# Patient Record
Sex: Male | Born: 1965 | Race: White | Hispanic: No | Smoking: Never smoker
Health system: Southern US, Community
[De-identification: ages and names within clinical notes are randomized; demographics above are authoritative.]

## PROBLEM LIST (undated history)

## (undated) DIAGNOSIS — T148XXA Other injury of unspecified body region, initial encounter: Secondary | ICD-10-CM

## (undated) DIAGNOSIS — F329 Major depressive disorder, single episode, unspecified: Secondary | ICD-10-CM

## (undated) DIAGNOSIS — I10 Essential (primary) hypertension: Secondary | ICD-10-CM

## (undated) DIAGNOSIS — F32A Depression, unspecified: Secondary | ICD-10-CM

## (undated) DIAGNOSIS — F419 Anxiety disorder, unspecified: Secondary | ICD-10-CM

## (undated) HISTORY — PX: APPENDECTOMY: SHX54

## (undated) HISTORY — DX: Depression, unspecified: F32.A

## (undated) HISTORY — DX: Major depressive disorder, single episode, unspecified: F32.9

## (undated) HISTORY — DX: Anxiety disorder, unspecified: F41.9

## (undated) HISTORY — DX: Other injury of unspecified body region, initial encounter: T14.8XXA

---

## 1994-02-06 HISTORY — PX: CHOLECYSTECTOMY: SHX55

## 2002-02-06 HISTORY — PX: OTHER SURGICAL HISTORY: SHX169

## 2009-05-18 ENCOUNTER — Ambulatory Visit: Payer: Self-pay | Admitting: Family Medicine

## 2009-05-18 DIAGNOSIS — I10 Essential (primary) hypertension: Secondary | ICD-10-CM | POA: Insufficient documentation

## 2009-05-30 ENCOUNTER — Ambulatory Visit: Payer: Self-pay | Admitting: Family Medicine

## 2009-05-30 DIAGNOSIS — L301 Dyshidrosis [pompholyx]: Secondary | ICD-10-CM | POA: Insufficient documentation

## 2009-05-31 LAB — CONVERTED CEMR LAB
ALT: 16 units/L (ref 0–53)
AST: 19 units/L (ref 0–37)
Alkaline Phosphatase: 83 units/L (ref 39–117)
BUN: 9 mg/dL (ref 6–23)
CO2: 26 meq/L (ref 19–32)
Creatinine, Ser: 0.76 mg/dL (ref 0.40–1.50)
HDL: 55 mg/dL (ref >39)
LDL Cholesterol: 38 mg/dL (ref 0–99)
RDW: 13 % (ref 11.5–15.5)
Sodium: 141 meq/L (ref 135–145)
Total Bilirubin: 0.8 mg/dL (ref 0.3–1.2)
WBC: 5.3 10*3/uL (ref 4.0–10.5)

## 2009-06-20 ENCOUNTER — Ambulatory Visit: Payer: Self-pay | Admitting: Family Medicine

## 2009-07-17 ENCOUNTER — Ambulatory Visit: Payer: Self-pay | Admitting: Family Medicine

## 2009-07-17 DIAGNOSIS — S46909A Unspecified injury of unspecified muscle, fascia and tendon at shoulder and upper arm level, unspecified arm, initial encounter: Secondary | ICD-10-CM | POA: Insufficient documentation

## 2009-07-17 DIAGNOSIS — S4980XA Other specified injuries of shoulder and upper arm, unspecified arm, initial encounter: Secondary | ICD-10-CM | POA: Insufficient documentation

## 2009-07-18 LAB — CONVERTED CEMR LAB
CO2: 27 meq/L (ref 19–32)
Chloride: 104 meq/L (ref 96–112)
Creatinine, Ser: 0.77 mg/dL (ref 0.40–1.50)
Glucose, Bld: 99 mg/dL (ref 70–99)

## 2009-09-01 ENCOUNTER — Ambulatory Visit: Payer: Self-pay | Admitting: Family Medicine

## 2009-09-01 ENCOUNTER — Encounter: Admission: RE | Admit: 2009-09-01 | Discharge: 2009-09-01 | Payer: Self-pay | Admitting: Family Medicine

## 2009-09-01 DIAGNOSIS — M25519 Pain in unspecified shoulder: Secondary | ICD-10-CM | POA: Insufficient documentation

## 2009-09-01 DIAGNOSIS — M67919 Unspecified disorder of synovium and tendon, unspecified shoulder: Secondary | ICD-10-CM | POA: Insufficient documentation

## 2009-09-01 DIAGNOSIS — M719 Bursopathy, unspecified: Secondary | ICD-10-CM

## 2009-09-14 ENCOUNTER — Ambulatory Visit: Payer: Self-pay | Admitting: Family Medicine

## 2009-09-14 ENCOUNTER — Encounter: Admission: RE | Admit: 2009-09-14 | Discharge: 2009-09-14 | Payer: Self-pay | Admitting: Family Medicine

## 2009-09-14 DIAGNOSIS — M5090 Cervical disc disorder, unspecified, unspecified cervical region: Secondary | ICD-10-CM | POA: Insufficient documentation

## 2009-09-29 ENCOUNTER — Ambulatory Visit: Payer: Self-pay | Admitting: Family Medicine

## 2010-05-08 NOTE — Assessment & Plan Note (Signed)
Summary: RTC syndrome   Vital Signs:  Patient profile:   45 year old male Height:      70.5 inches Weight:      239 pounds Pulse rate:   94 / minute BP sitting:   145 / 91  (left arm) Cuff size:   large  Vitals Entered By: Kathlene November (Sep 01, 2009 2:42 PM) CC: right shoulder pain still- Motrin and home exercises no help- pain is worse   Primary Care Provider:  Nani Gasser MD  CC:  right shoulder pain still- Motrin and home exercises no help- pain is worse.  History of Present Illness: 45 yo WM presents for continued R shoulder pain x 6 mos, getting worse.  Saw Dr Linford Arnold for it last month and has only been taking Motrin but it is not helping.  Has full ROM but has constant pain with movement and at rest.  Keepiing him up at6 night.  Over the superior, posterior and anterior region, radiates down the deltoid.  No neck pain or paresthesias.  Has never had shoulder surgery or imaging done.  Current Medications (verified): 1)  Lisinopril-Hydrochlorothiazide 20-12.5 Mg Tabs (Lisinopril-Hydrochlorothiazide) .... Take 1 Tablet By Mouth Once A Day  Allergies (verified): No Known Drug Allergies  Comments:  Nurse/Medical Assistant: The patient's medications and allergies were reviewed with the patient and were updated in the Medication and Allergy Lists. Kathlene November (Sep 01, 2009 2:43 PM)  Past History:  Past Medical History: Reviewed history from 05/18/2009 and no changes required. Nerve damage from neck surgery  Social History: Reviewed history from 05/30/2009 and no changes required. 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  HS degree w/ some educa at [employer].  LIves alone. Has one child.  Non-smoker ETOH-yes Drugs-no Company secretary Regular exercise-yes  Review of Systems      See HPI  Physical Exam  General:  alert, well-developed, well-nourished, and well-hydrated.   Msk:  Neg Hawkins sign.  + empty can test.  full stregnth with drop arm test.   Slightly limited int/ ext rotation.  full opposed stregth of bicepts/ tricepts.  Neg Yergasons' test.  + grip 5/5 bilat, full C spine active ROM Pulses:  2+ radial pulses Skin:  color normal.     Impression & Recommendations:  Problem # 1:  SHOULDER PAIN, RIGHT (ICD-719.41) RTC tendonitis, probable.  Due to mod-severe pain and findings on exam, did corticosteroid injection.  Pt tolerated well.  Will procede with XRAy just in case he needs MRI down the road.  Will start RX Meloxicam and Vicodin only at night.  RTC in 3-4 wks for f/u. His updated medication list for this problem includes:    Meloxicam 7.5 Mg Tabs (Meloxicam) .Marland Kitchen... 1-2 tabs by mouth daily with food    Hydrocodone-acetaminophen 5-500 Mg Tabs (Hydrocodone-acetaminophen) .Marland Kitchen... 1-2 tabs by mouth at bedtime as needed shoulder pain  Orders: T-DG Shoulder*R* (62130)  Complete Medication List: 1)  Lisinopril-hydrochlorothiazide 20-12.5 Mg Tabs (Lisinopril-hydrochlorothiazide) .... Take 1 tablet by mouth once a day 2)  Meloxicam 7.5 Mg Tabs (Meloxicam) .Marland Kitchen.. 1-2 tabs by mouth daily with food 3)  Hydrocodone-acetaminophen 5-500 Mg Tabs (Hydrocodone-acetaminophen) .Marland Kitchen.. 1-2 tabs by mouth at bedtime as needed shoulder pain  Other Orders: Joint Aspirate / Injection, Large (20610)  Patient Instructions: 1)  Xray shoulder today. 2)  Will call you w/ results on Tues. 3)  Inject shoulder today. 4)  Start Meloxicam once a day with food as your anti inflammatory. 5)  Use Vicodin at night  for severe pain. 6)  Return for f/u shoulder pain in 4 wks. Prescriptions: HYDROCODONE-ACETAMINOPHEN 5-500 MG TABS (HYDROCODONE-ACETAMINOPHEN) 1-2 tabs by mouth at bedtime as needed shoulder pain  #24 x 0   Entered and Authorized by:   Seymour Bars DO   Signed by:   Seymour Bars DO on 09/01/2009   Method used:   Printed then faxed [facility] ...       [facility]  [address] [phone]* (retail)       [address] [place], [place]  [address]       Ph:  [phone] or [phone]       Fax: [phone]   RxID:   [id number] MELOXICAM 7.5 MG TABS (MELOXICAM) 1-2 tabs by mouth daily with food  #40 x 1   Entered and Authorized by:   Seymour Bars DO   Signed by:   Seymour Bars DO on 09/01/2009   Method used:   [facility]        [facility] [phone]* (retail)       [address] [place], [place]  [address]       Ph: [phone] or [phone]       Fax: [phone]   RxID:   [id number]    Procedure Note  Injections: The patient complains of pain but denies fever. Duration of symptoms: 6 mos Indication: acute on chronic pain Consent signed: no  Procedure # 1: joint injection    Region: R shoulder    Location: posterior approach    Technique: 22 gauge    Medication: 40 mg depomedrol    Anesthesia: cold spray    Comment: DepoMedrol 40mg  L#[id number] Exp.04/2011 Lidocaine 1% L# [id number]  Exp.01/2012  Cleaned and prepped with: alcohol and betadine Wound dressing: bandaid Instructions: daily dressing changes and ice Additional Instructions: Pt tolerated procedure well w/o complication.

## 2010-05-08 NOTE — Assessment & Plan Note (Signed)
Summary: f/u neck/ shoulder pain   Vital Signs:  Patient profile:   45 year old male Height:      70.5 inches Weight:      240 pounds BMI:     34.07 O2 Sat:      78 % on Room air Pulse rate:   78 / minute BP sitting:   119 / 80  (left arm) Cuff size:   large  Vitals Entered By: Payton Spark CMA (September 14, 2009 10:05 AM)  O2 Flow:  Room air CC: R shoulder pain no better   Primary Care Provider:  Nani Gasser MD  CC:  R shoulder pain no better.  History of Present Illness: 45 yo WM presents for R shoulder pain that is no better after a steroid injection 2 wks ago.  His pain is unchanged.  His Xray 2 wks ago was normal.  He has day and night pain.  the Vicodin didn't even help him.  He is taking Mobic daily which helps some.  He has had to leave work due to the pain.  He has full ROM and +  radiation of pain shooting down the the hand w/ mild hand weakness, no numbness.  Pain is interupting his sleep.  He has hx of cervical disc surgery from 2002 at [facility].      Current Medications (verified): 1)  Lisinopril-Hydrochlorothiazide 20-12.5 Mg Tabs (Lisinopril-Hydrochlorothiazide) .... Take 1 Tablet By Mouth Once A Day 2)  Meloxicam 7.5 Mg Tabs (Meloxicam) .Marland Kitchen.. 1-2 Tabs By Mouth Daily With Food  Allergies (verified): No Known Drug Allergies  Past History:  Past Medical History: Reviewed history from 05/18/2009 and no changes required. 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 Neck surgery, cervical disckectomy 11/03 Cholecystectomy 11/95  Social History: Reviewed history from 05/30/2009 and no changes required. 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  LIves alone. Has one child.  Non-smoker ETOH-yes Drugs-no Company secretary Regular exercise-yes  Review of Systems      See HPI  Physical Exam  General:  alert, well-developed, well-nourished, and  well-hydrated.   Msk:  slight limited active C spine ROM with SB and rotation. tender over posterior and anterior R trapezious muscle with full R glenohumeral ROM,  grip + 5/5 bilat.   Pulses:  2+ radial pulses   Impression & Recommendations:  Problem # 1:  CERVICAL DISC DISORDER (ICD-722.91) Hx ov neck surgery in 03 with nerve damage.  He has not responded to a corticosteroid injection in the R shoulder, NSAIDs or Vicodin with symptoms of cervical radiculopathy.  Also had a normal shoulder xray and has full shoulder ROM w/o pain.  Will get a CT of the neck today to look for etiology of his worsening cervical radicular pain.  Will change his pain medication to Percocet and continue Meloxicam.   Orders: T-CT C Spine w/CM ([id number])  Complete Medication List: 1)  Lisinopril-hydrochlorothiazide 20-12.5 Mg Tabs (Lisinopril-hydrochlorothiazide) .... Take 1 tablet by mouth once a day 2)  Meloxicam 7.5 Mg Tabs (Meloxicam) .Marland Kitchen.. 1-2 tabs by mouth daily with food 3)  Percocet 5-325 Mg Tabs (Oxycodone-acetaminophen) .Marland Kitchen.. 1-2 tabs by mouth two times a day as needed severe pain  Patient Instructions: 1)  CT neck today. 2)  Will call you w/ results tomorrow. 3)  Change Vicodin to Percocet.   4)  this may cause constipation or sedation. 5)  Stay on  Meloxicam once a day. Prescriptions: PERCOCET 5-325 MG TABS (OXYCODONE-ACETAMINOPHEN) 1-2 tabs by mouth two times a day as needed severe pain  #30 x 0   Entered and Authorized by:   Seymour Bars DO   Signed by:   Seymour Bars DO on 09/14/2009   Method used:   Print then Give to Patient   RxID:   [id number]

## 2010-05-08 NOTE — Letter (Signed)
Summary: Out of Work  [facility]  [address]   [place], [place] [address]   Phone: [phone]  Fax: [phone]    May 18, 2009   Employee:  Christopher Lloyd    To Whom It May Concern:   For Medical reasons, please excuse the above named employee from work for the following dates:  Start:   18 May 2009    End:   21 May 2009  If you need additional information, please feel free to contact our office.         Sincerely,    Marvis Moeller DO

## 2010-05-08 NOTE — Assessment & Plan Note (Signed)
Summary: Lack of concentratin, HTN   Vital Signs:  Patient profile:   45 year old male Height:      70.5 inches Weight:      244 pounds Pulse rate:   89 / minute BP sitting:   156 / 88  (left arm) Cuff size:   large  Vitals Entered By: Kathlene November (June 20, 2009 3:56 PM) CC: light headed and trouble concentrating since Saturday   Primary Care Provider:  Nani Gasser MD  CC:  light headed and trouble concentrating since Saturday.  History of Present Illness: Christopher Lloyd is a 45 year old man presenting with trouble concentrating. He has been having episodes every day since Saturday in which he has trouble concentrating for several hours. For example, one day he was counting change and found that he was confused and could not concentrate on the task. 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 When this happens he has to fight to keep close attention. This has been going on for years but this is the first time it has happened in 6 months.  No memory problems or distractability. No warning signs, nausea, abdominal pain, headache. Has not tried any medicine. No vision changes, hearing changes, speech changes, tingling, or numbness.   BP is 156/88 today.  Current Medications (verified): 1)  Triamcinolone Acetonide 0.5 % Oint (Triamcinolone Acetonide) .... Apply To Affected Area On Feet Two Times A Day For Up To 2 Weeks.  Allergies (verified): No Known Drug Allergies  Comments:  Nurse/Medical Assistant: The patient's medications and allergies were reviewed with the patient and were updated in the Medication and Allergy Lists. Kathlene November (June 20, 2009 3:57 PM)  Physical Exam  General:  Overweight-appearing male in no acute distress.  Head:  Normocephalic and atraumatic with male-pattern balding. Facial flushing present.  Eyes:  Sclera clear with no corneal or conjunctival inflammation noted. Neck:  Neck supple with no lymphadenopathy. Lungs:  Normal  respiratory effort, chest expands symmetrically. Lungs are clear to auscultation with no wheezes, rales, or rhonchi.  Heart:  Regular rate and rhythm. Normal S1 and S2 with no murmur, rub, or gallop. Pulses:  Radial pulses 2+ bilaterally. Extremities:  No cyanosis, clubbing, or edema.  Skin:  no rashes.   Cervical Nodes:  No lymphadenopathy noted Psych:  Flat affect.    Impression & Recommendations:  Problem # 1:  ELEVATED BLOOD PRESSURE WITHOUT DIAGNOSIS OF HYPERTENSION (ICD-796.2) Episodes of poor concentration may be related to spikes in blood pressure. BP 156/88 today and he has gotten high readings at home in the past. Will start lisinopril-HCTZ today. Counseled pt on possible side effects. Follow up in 3 weeks to determine whether episodes have decreased once BP is better controlled. still continue to work on weight loss. Says he already abides by low salt diet.   His updated medication list for this problem includes:    Lisinopril-hydrochlorothiazide 10-12.5 Mg Tabs (Lisinopril-hydrochlorothiazide) .Marland Kitchen... Take 1 tablet by mouth once a day  Complete Medication List: 1)  Triamcinolone Acetonide 0.5 % Oint (Triamcinolone acetonide) .... Apply to affected area on feet two times a day for up to 2 weeks. 2)  Lisinopril-hydrochlorothiazide 10-12.5 Mg Tabs (Lisinopril-hydrochlorothiazide) .... Take 1 tablet by mouth once a day Prescriptions: LISINOPRIL-HYDROCHLOROTHIAZIDE 10-12.5 MG TABS (LISINOPRIL-HYDROCHLOROTHIAZIDE) Take 1 tablet by mouth once a day  #30 x 1   Entered and Authorized by:   Nani Gasser MD   Signed by:   Nani Gasser MD on 06/20/2009   Method  used:   [facility]        [facility] [phone]* (retail)       [address] [place], [place]  [address]       Ph: [phone] or [phone]       Fax: [phone]   RxID:   [id number]

## 2010-05-08 NOTE — Assessment & Plan Note (Signed)
Summary: NOV: BP   Vital Signs:  Patient profile:   45 year old male Height:      70.5 inches Weight:      242 pounds BMI:     34.36 Pulse rate:   78 / minute BP sitting:   138 / 84  (left arm) Cuff size:   large  Vitals Entered By: Kathlene November (May 30, 2009 9:19 AM) CC: NP- get established Is Patient Diabetic? No   Primary Care Provider:  Nani Gasser MD  CC:  NP- get established.  History of Present Illness: Here to estab care because told had high BP at urgent care and worreid because he has a family hs ov cancer. No CP, SOB or HA.   Rash on feet for several years.  Itches some. ON teh base of the feel.  By the end of the day the socks are wet. Says rash is worse by the end of the day. Not using any OTC treatments. Says his feet have never been sweaty before.   Habits & Providers  Alcohol-Tobacco-Diet     Alcohol drinks/day: 2     Tobacco Status: never  Exercise-Depression-Behavior     Does Patient Exercise: yes     STD Risk: never     Drug Use: never     Seat Belt Use: always  Current Medications (verified): 1)  None  Allergies (verified): No Known Drug Allergies  Comments:  Nurse/Medical Assistant: The patient's medications and allergies were reviewed with the patient and were updated in the Medication and Allergy Lists. Kathlene November (May 30, 2009 9:20 AM)  Past History:  Past Surgical History: Neck surgery, disckectomy 11/03 Cholecystectomy 11/95  Family History: Mother,D, [place] GM died brain Ca Aunt died leukemia Father, Zachery Dauer HTN  Social History: Company secretary for Teacher, adult education.  HS degree w/ some educa at [employer].  LIves alone. Has one child.  Non-smoker ETOH-yes Drugs-no Company secretary Regular exercise-yes Smoking Status:  never Does Patient Exercise:  yes STD Risk:  never Drug Use:  never Seat Belt Use:  always  Review of Systems       No fever/sweats/weakness, unexplained weight loss/gain.  No  vison changes.  No difficulty hearing/ringing in ears, hay fever/allergies.  No chest pain/discomfort, palpitations.  No Br lump/nipple discharge.  No cough/wheeze.  No blood in BM, nausea/vomiting/diarrhea.  No nighttime urination, leaking urine, unusual vaginal bleeding, discharge (penis or vagina).  No muscle/joint pain. No rash, change in mole.  No HA, memory loss.  No anxiety, +sleep d/o, depression.  No easy bruising/bleeding, unexplained lump.    Physical Exam  General:  Well-developed,well-nourished,in no acute distress; alert,appropriate and cooperative throughout examination Head:  Normocephalic and atraumatic without obvious abnormalities. No apparent alopecia or balding. Eyes:  No corneal or conjunctival inflammation noted. EOMI. Perrla. Ears:  External ear exam shows no significant lesions or deformities.  Otoscopic examination reveals clear canals, tympanic membranes are intact bilaterally without bulging, retraction, inflammation or discharge. Hearing is grossly normal bilaterally. Nose:  External nasal examination shows no deformity or inflammation. Nasal mucosa are pink and moist without lesions or exudates. Mouth:  Oral mucosa and oropharynx without lesions or exudates.  Teeth in good repair. Neck:  No deformities, masses, or tenderness noted. No TM.  Lungs:  Normal respiratory effort, chest expands symmetrically. Lungs are clear to auscultation, no crackles or wheezes. Heart:  Normal rate and regular rhythm. S1 and S2 normal without gallop, murmur, click, rub or other extra sounds.  No carotid or abdominal bruits.  Abdomen:  Bowel sounds positive,abdomen soft and non-tender without masses, organomegaly or hernias noted. Pulses:  Radial and DP pulse 2+  Extremities:  Trace edewma on the left ankle.  Neurologic:  alert & oriented X3.   Skin:  no rashes.   Cervical Nodes:  No lymphadenopathy noted Psych:  Cognition and judgment appear intact. Alert and cooperative with normal  attention span and concentration. No apparent delusions, illusions, hallucinations   Impression & Recommendations:  Problem # 1:  ELEVATED BLOOD PRESSURE WITHOUT DIAGNOSIS OF HYPERTENSION (ICD-796.2) Improved today but pt says has reported mutiple time of elevation. Discussed low salt diet. low fat diet and weight loss. Really needs to lose about 40 lbs.   Will also get screening labs to rule out thyroid problems, kidney dz, and cholesterol. F/U in 4 weeks to see if deitary changes and exericse have made some improvement.   Orders: T-Comprehensive Metabolic Panel [id number]) T-Lipid Profile [id number]) T-TSH [id number]) T-CBC No Diff ([id number])  Complete Medication List: 1)  Triamcinolone Acetonide 0.5 % Oint (Triamcinolone acetonide) .... Apply to affected area on feet two times a day for up to 2 weeks.  Patient Instructions: 1)  Google DASH diet (.nih/gov) website.  2)  Recommend weight loss and low fat diet.  Prescriptions: TRIAMCINOLONE ACETONIDE 0.5 % OINT (TRIAMCINOLONE ACETONIDE) Apply to affected area on feet two times a day for up to 2 weeks.  #40 grams. x 0   Entered and Authorized by:   Nani Gasser MD   Signed by:   Nani Gasser MD on 05/30/2009   Method used:   [facility]        [facility] [phone]* (retail)       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 If not better in 2 weeks the please let me know and can try an nystatin powder but the rash doesn look fungs  On the base of the foot has thick dyr scaling skin, no erythema, cracks or drainage.   Complete Medication List: 1)  Triamcinolone Acetonide 0.5 % Oint (Triamcinolone acetonide) .... Apply to affected area on feet two times a  day for up to 2 weeks.

## 2010-05-08 NOTE — Assessment & Plan Note (Signed)
Summary: FEVER,CHILLS,COUGH/TJ   Vital Signs:  Patient Profile:   45 Years Old Male CC:      Cough x 2 months, earache, sore throat x 2 weeks Height:     70 inches Weight:      238 pounds O2 Sat:      100 % O2 treatment:    Room Air Temp:     98.2 degrees F oral Pulse rate:   87 / minute Pulse rhythm:   regular Resp:     16 per minute BP sitting:   156 / 103  (right arm) Cuff size:   large  Vitals Entered By: Emilio Math (May 18, 2009 5:38 PM)                  Current Allergies: No known allergies History of Present Illness Chief Complaint: Cough x 2 months, earache, sore throat x 2 weeks History of Present Illness: PATIENT REPORTS HE HAS HAD A COUGH INTERMITTANTLY FOR OVER 2 MONTHS. COUGH IS DRY. KEEPING HIM FORM SLEEPING ON OCCASION. DENIES FEVER. OVER THE PAST 2 WKS HE HAS DEVELOPED A SORE THROAT AND SOME EAR PAIN. HAS RUNNY NOSE. DENIES ANY OTC MEDICATION USE. DENIES COUGHING UP ANY BLOOD. DOES NOT HAVE A FAMILY PHYSICIAN. STATES EHE LAST TIME HE HAS A GOOD CHECK UP WAS 5 YRS AGO. 3 YRS AGO HE SPENT THE NIGHT IN THE HOSPITAL DUE TO CHEST PAIN BUT STATES HIS TESTS WHERE ALL NEG. HE GIVE A FAMILY HX OF CANCER. NOT SURE TYPE BUT HIS MOTHERE DIED IN HER FIFTIES OF " BEING EAT UP WITH CANCER"  REVIEW OF SYSTEMS Constitutional Symptoms      Denies fever, chills, night sweats, weight loss, weight gain, and fatigue.  Eyes       Denies change in vision, eye pain, eye discharge, glasses, contact lenses, and eye surgery. Ear/Nose/Throat/Mouth       Complains of frequent runny nose, sinus problems, and sore throat.      Denies hearing loss/aids, change in hearing, ear pain, ear discharge, dizziness, frequent nose bleeds, hoarseness, and tooth pain or bleeding.  Respiratory       Complains of dry cough.      Denies productive cough, wheezing, shortness of breath, asthma, bronchitis, and emphysema/COPD.  Cardiovascular       Denies murmurs, chest pain, and tires easily with  exhertion.    Gastrointestinal       Denies stomach pain, nausea/vomiting, diarrhea, constipation, blood in bowel movements, and indigestion. Genitourniary       Denies painful urination, kidney stones, and loss of urinary control. Neurological       Denies paralysis, seizures, and fainting/blackouts. Musculoskeletal       Complains of muscle pain and joint pain.      Denies joint stiffness, decreased range of motion, redness, swelling, muscle weakness, and gout.  Skin       Denies bruising, unusual mles/lumps or sores, and hair/skin or nail changes.  Psych       Complains of sleep problems.      Denies mood changes, temper/anger issues, anxiety/stress, speech problems, and depression.  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 CONSTANT COUGH Heart: regular rate and  rhythm, no murmur Extremities: normal extremities Skin: no obvious rashes or lesions Assessment New Problems: ELEVATED BLOOD PRESSURE WITHOUT DIAGNOSIS OF HYPERTENSION (ICD-796.2) COUGH (ICD-786.2) UPPER RESPIRATORY INFECTION, ACUTE (ICD-465.9)   Plan New Medications/Changes: CHERATUSSIN AC 100-10 MG/5ML SYRP (GUAIFENESIN-CODEINE) 1-2 TSP by mouth Q 6 HRS as needed COUGH  #4 OZ x 0, 05/18/2009, Kimberly Lykins DO ZITHROMAX Z-PAK 250 MG TABS (AZITHROMYCIN) TAKE AS DIRECTED  #1 PK x 0, 05/18/2009, Marvis Moeller DO  New Orders: New Patient Level IV [99204]     Prescriptions: CHERATUSSIN AC 100-10 MG/5ML SYRP (GUAIFENESIN-CODEINE) 1-2 TSP by mouth Q 6 HRS as needed COUGH  #4 OZ x 0   Entered and Authorized by:   Marvis Moeller DO   Signed by:   Marvis Moeller DO on 05/18/2009   Method used:   Print then Give to Patient   RxID:   [id number] ZITHROMAX Z-PAK 250 MG TABS (AZITHROMYCIN) TAKE AS DIRECTED  #1 PK x 0   Entered and Authorized by:   Marvis Moeller DO   Signed by:   Marvis Moeller DO on 05/18/2009   Method used:   Print then Give to Patient   RxID:   [id number]   Patient Instructions: 1)  TYLENOL OR MOTRIN AS NEEDED. AVOID CAFFEINE AND MILK PRODUCTS. RECOMMEND HE ESTABLISH WITH A PCP TO EVAL ELEVATED BP, AND FOR HEALTH MAINTAINECE.

## 2010-05-08 NOTE — Assessment & Plan Note (Signed)
Summary: 3 WEEK FU HTN, new onset shoulder pain   Vital Signs:  Patient profile:   45 year old male Height:      70.5 inches Weight:      244 pounds Pulse rate:   83 / minute BP sitting:   135 / 83  (left arm) Cuff size:   large  Vitals Entered By: Kathlene November (July 17, 2009 10:11 AM) CC: follow-up Bp and having right shoulder pain for 2-3 months now, Hypertension Management   Primary Care Provider:  Nani Gasser MD  CC:  follow-up Bp and having right shoulder pain for 2-3 months now and Hypertension Management.  History of Present Illness: follow-up Bp and having right shoulder pain for 2-3 months now.  Started after threw a 70 lb box at work into [employer]. Pain is through entire right shoulder. Occ pain will shoot tino the arm above the wrist.  Feels occ weak. Can move it without difficulty.  Says pain will just start and when does is almost unbearable. No aggreavting or aleviating sxs. No pain meds.    NO rash.  No old injuries to that shoulder.    Hypertension History:      He denies headache, chest pain, palpitations, dyspnea with exertion, orthopnea, PND, peripheral edema, visual symptoms, neurologic problems, syncope, and side effects from treatment.  After about a week felt better, felt less fuzzy headed. Marland Kitchen        Positive major cardiovascular risk factors include hypertension.  Negative major cardiovascular risk factors include male age less than 64 years old and non-tobacco-user status.     Current Medications (verified): 1)  Lisinopril-Hydrochlorothiazide 10-12.5 Mg Tabs (Lisinopril-Hydrochlorothiazide) .... Take 1 Tablet By Mouth Once A Day  Allergies (verified): No Known Drug Allergies  Comments:  Nurse/Medical Assistant: The patient's medications and allergies were reviewed with the patient and were updated in the Medication and Allergy Lists. Kathlene November (July 17, 2009 10:12 AM)  Past History:  Past Surgical History: Neck surgery, cervical  disckectomy 11/03 Cholecystectomy 11/95  Physical Exam  General:  Well-developed,well-nourished,in no acute distress; alert,appropriate and cooperative throughout examination Msk:  Right shoulder with no swelling or rsash. NOntender. NROM.  + empty can test. Hand strength 5/5. Normal crossover.  Pulses:  Radial 2+    Impression & Recommendations:  Problem # 1:  HYPERTENSION, BENIGN (ICD-401.1)  Looks much better today. Adjust dose to titrate down to 120s.  His updated medication list for this problem includes:    Lisinopril-hydrochlorothiazide 20-12.5 Mg Tabs (Lisinopril-hydrochlorothiazide) .Marland Kitchen... 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  Had +empty can test.  Discussed use of NSAIDs for 5 days. Hopefully this won't incrase his BP.  Also given H.O.on exercises for RCC.  IF not better in 2 weeks then please f/u. WIll hold off on xray since no trauma or impact injury.   Complete Medication List: 1)  Lisinopril-hydrochlorothiazide 20-12.5 Mg Tabs (Lisinopril-hydrochlorothiazide) .... Take 1 tablet by mouth once a day 2)  Trazodone Hcl 50 Mg Tabs (Trazodone hcl) .... Take 1 tablet by mouth once a day at bedtime for insomnia.  Hypertension Assessment/Plan:      The patient's hypertensive risk group is category A: No risk factors and no target organ damage.  His calculated 10 year risk of coronary heart disease is 3 %.  Today's blood pressure is 135/83.  His blood pressure goal is < 140/90.  Patient Instructions: 1)  Please schedule a follow-up appointment in 3 months  to recheck blood pressure. 2)  Go for labs today to check kidney function and potassium.  3)  Work on stretches for the shoulder and given handout. 4)  Take Ibuprofen 600mg  three times a day with food and water for about 5 days and then as needed for the shoulder.  5)  Follow up in 2  weeks if the shoulder is not better.  Prescriptions: TRAZODONE HCL 50 MG TABS (TRAZODONE HCL) Take 1 tablet by mouth once a day at bedtime for insomnia.  #30 x 0   Entered and Authorized by:   Nani Gasser MD   Signed by:   Nani Gasser MD on 07/17/2009   Method used:   [facility]ically to        [facility] [phone] (retail)       [address]       [place], [place]  [address]       Ph: [phone] or [phone]       Fax: [phone]   RxID:   [id number] LISINOPRIL-HYDROCHLOROTHIAZIDE 20-12.5 MG TABS (LISINOPRIL-HYDROCHLOROTHIAZIDE) Take 1 tablet by mouth once a day  #30 x 2   Entered and Authorized by:   Nani Gasser MD   Signed by:   Nani Gasser MD on 07/17/2009   Method used:   Electronically to        [facility] [phone]* (retail)       [address]       [place], [place]  [address]       Ph: [phone] or [phone]       Fax: [phone]   RxID:   [id number]

## 2010-06-11 ENCOUNTER — Ambulatory Visit (INDEPENDENT_AMBULATORY_CARE_PROVIDER_SITE_OTHER): Payer: Self-pay | Admitting: Family Medicine

## 2010-06-11 ENCOUNTER — Encounter: Payer: Self-pay | Admitting: Family Medicine

## 2010-06-11 DIAGNOSIS — I10 Essential (primary) hypertension: Secondary | ICD-10-CM

## 2010-06-11 DIAGNOSIS — G4700 Insomnia, unspecified: Secondary | ICD-10-CM

## 2010-06-19 NOTE — Assessment & Plan Note (Signed)
Summary: HTN, insomnia   Vital Signs:  Patient profile:   45 year old male Height:      70.5 inches Weight:      266.75 pounds BMI:     37.87 O2 Sat:      100 % on Room air Pulse rate:   78 / minute BP sitting:   159 / 104  (right arm) Cuff size:   large  Vitals Entered By: Christopher Lloyd CMA Christopher Lloyd) (June 11, 2010 3:34 PM)  O2 Flow:  Room air CC: High BP, no meter at home "I can just tell", no meds in 4-5 months....SP Is Patient Diabetic? No   Primary Care Provider:  Nani Gasser Lloyd  CC:  High BP, no meter at home "I can just tell", and no meds in 4-5 months....SP.  History of Present Illness: High BP, no meter at home "I can just tell", no meds in 4-5 months....SP. Has been having HA and feeling lightheaed.Marland Kitchen Has been oout of BP meds for several months.  Tolerated previous med well.   Insomnia for 10 years. Christopher Lloyd goes to bed around 10:300 or 11.  Wakes up at 7AM.  Has a problem staying asleep.  Once wakes up usually awake for 2-3 hours.  Often take a cuople of hours to sleep. Doesn't watch TV in the bedroom. Does listen ot the radio though. BEdroom is quiet and dark. No caffeine after lunch. Tried trazodone in the past and felt it didn't work well.    Current Medications (verified): 1)  None  Allergies (verified): No Known Drug Allergies  Family History: Reviewed history from 05/30/2009 and no changes required. Mother,D, CA GM died brain Ca Aunt died leukemia Father, UNK Ucnle HTN  Social History: Reviewed history from 05/30/2009 and no changes required. 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  HS degree w/ some educa at [employer].  LIves alone. Has one child.  Non-smoker ETOH-yes Drugs-no Company secretary Regular exercise-yes  Physical Exam  General:  Well-developed,well-nourished,in no acute distress; alert,appropriate and cooperative throughout examination Lungs:  Normal respiratory effort, chest expands symmetrically. Lungs are clear to  auscultation, no crackles or wheezes. Heart:  Normal rate and regular rhythm. S1 and S2 normal without gallop, murmur, click, rub or other extra sounds. no carotid bruits.  Pulses:  RAdial 2+  Extremities:  NO LE edema.    Impression & Recommendations:  Problem # 1:  HYPERTENSION, BENIGN (ICD-401.1) Assessment Deteriorated F/U in 3-4  weeks. Check CMP at that time.  The following medications were removed from the medication list:    Lisinopril-hydrochlorothiazide 20-12.5 Mg Tabs (Lisinopril-hydrochlorothiazide) .Marland Kitchen... Take 1 tablet by mouth once a day His updated medication list for this problem includes:    Lisinopril-hydrochlorothiazide 20-12.5 Mg Tabs (Lisinopril-hydrochlorothiazide) .Marland Kitchen... Take 1 tablet by mouth once a day  Problem # 2:  INSOMNIA (ICD-780.52) Assessment: New Discussed options. Wil try [place].. REviewed sleep hygien.  Reviewed med S.E. Call if any concernes.  His updated medication list for this problem includes:    Zolpidem Tartrate 10 Mg Tabs (Zolpidem tartrate) .Marland Kitchen... Take 1 tablet by mouth once a day  Complete Medication List: 1)  Lisinopril-hydrochlorothiazide 20-12.5 Mg Tabs (Lisinopril-hydrochlorothiazide) .... Take 1 tablet by mouth once a day 2)  Zolpidem Tartrate 10 Mg Tabs (Zolpidem tartrate) .... Take 1 tablet by mouth once a day  Patient Instructions: 1)  F/U in 3-4  weeks for blood pressure and sleep.  Prescriptions: ZOLPIDEM TARTRATE 10 MG TABS (ZOLPIDEM TARTRATE) Take 1 tablet by mouth  once a day  #30 x 0   Entered and Authorized by:   Christopher Lloyd   Signed by:   Christopher Lloyd on 06/11/2010   Method used:   Printed then faxed to ...       [facility] [address] [phone][facility] (retail)       [address] [place], [place]  [address]       Ph: [phone] or [phone]       Fax: [phone]   RxID:   [id number] LISINOPRIL-HYDROCHLOROTHIAZIDE 20-12.5 MG TABS (LISINOPRIL-HYDROCHLOROTHIAZIDE) Take 1 tablet by mouth once a day  #30  x 0   Entered and Authorized by:   Christopher Lloyd   Signed by:   Christopher Lloyd on 06/11/2010   Method used:   Electronically to        [facility] [phone][facility] (retail)       [address]       [place], [place]  [address]       Ph: [phone] or [phone]       Fax: [phone]   RxID:   [id number]    Orders Added: 1)  Est. Patient Level IV [37628]

## 2010-07-09 ENCOUNTER — Encounter: Payer: Self-pay | Admitting: Family Medicine

## 2010-07-10 ENCOUNTER — Encounter: Payer: Self-pay | Admitting: Family Medicine

## 2010-07-10 ENCOUNTER — Ambulatory Visit (INDEPENDENT_AMBULATORY_CARE_PROVIDER_SITE_OTHER): Payer: BC Managed Care – PPO | Admitting: Family Medicine

## 2010-07-10 VITALS — BP 138/90 | HR 49 | Ht 70.0 in | Wt 259.0 lb

## 2010-07-10 DIAGNOSIS — G4700 Insomnia, unspecified: Secondary | ICD-10-CM

## 2010-07-10 DIAGNOSIS — M5090 Cervical disc disorder, unspecified, unspecified cervical region: Secondary | ICD-10-CM

## 2010-07-10 DIAGNOSIS — I10 Essential (primary) hypertension: Secondary | ICD-10-CM

## 2010-07-10 MED ORDER — LISINOPRIL-HYDROCHLOROTHIAZIDE 20-25 MG PO TABS: 1.0000 | ORAL_TABLET | Freq: Every day | ORAL | Status: DC

## 2010-07-10 MED ORDER — METAXALONE 800 MG PO TABS: 800.0000 mg | ORAL_TABLET | Freq: Three times a day (TID) | ORAL | Status: AC

## 2010-07-10 MED ORDER — OXYCODONE-ACETAMINOPHEN 5-325 MG PO TABS: 1.0000 | ORAL_TABLET | ORAL | Status: AC | PRN

## 2010-07-10 MED ORDER — ZOLPIDEM TARTRATE 10 MG PO TABS: 10.0000 mg | ORAL_TABLET | Freq: Every evening | ORAL | Status: DC | PRN

## 2010-07-10 NOTE — Progress Notes (Signed)
  Subjective:    Patient ID: Christopher Lloyd, male    DOB: 22-Jun-1965, 45 y.o.   MRN: [id number]  Hypertension This is a chronic problem. The problem has been gradually improving since onset. The problem is uncontrolled. Associated symptoms include neck pain. Pertinent negatives include no chest pain, palpitations, peripheral edema or shortness of breath. There are no associated agents to hypertension. Past treatments include ACE inhibitors and diuretics. The current treatment provides moderate improvement. There are no compliance problems.   Neck Pain  This is a recurrent problem. The current episode started in the past 7 days. The problem occurs constantly. The problem has been unchanged. The pain is associated with a twisting injury. The pain is present in the right side. The quality of the pain is described as aching, burning and stabbing. The pain is severe. The symptoms are aggravated by position and bending. The pain is same all the time. Stiffness is present all day. Associated symptoms include numbness and tingling. Pertinent negatives include no chest pain, fever or weakness. Associated symptoms comments: In the right hand. But not new. . He has tried heat for the symptoms. The treatment provided mild relief.  Has had previous MRI of neck to show some inpingement.     Ge tting 6 hours on the [place] and feels getting better quality sleep. No amnesia or s.e. On the medication   Having sig pain in his right side of neck and radiates into his shoulder and over the clavicle and shoulder blade.  Review of Systems  Constitutional: Negative for fever.  HENT: Positive for neck pain.   Respiratory: Negative for shortness of breath.   Cardiovascular: Negative for chest pain and palpitations.  Neurological: Positive for tingling and numbness. Negative for weakness.       Objective:   Physical Exam  Constitutional: He appears well-developed and well-nourished.  Neck: Neck supple. Spinous process  tenderness and muscular tenderness present. Decreased range of motion present. No edema and no erythema present.       Unable to fully extend. Dec rotation to the left.    Cardiovascular: Normal rate, regular rhythm and normal heart sounds.   Pulmonary/Chest: Effort normal and breath sounds normal.  Musculoskeletal:       Tender bt the scapula and the spine on the right. Palpable muscle spasm.  Shoulder, elbows, wrist and fingers with NROM and strength. Some pain in the right shoulder with external rotation.            Assessment & Plan:

## 2010-07-10 NOTE — Assessment & Plan Note (Addendum)
Much improved but still not at goal. Increase med and recheck in 1 mo.  Can rechekc BMP at that time.

## 2010-07-10 NOTE — Assessment & Plan Note (Signed)
Flare of cervical disc problem. He did see ortho last summer who recommended PT. He says financially he can't do this right now.  Discussed need for anti-inflammatory Aleve or IBU with food.  Also given #30 percocet for prn use. Didn't feel flexeril helped in teh past so will try skelaxin. If not better in 3-4 weeks then recommend referral back to ortho or reconsider PT.

## 2010-07-10 NOTE — Assessment & Plan Note (Signed)
Much improved on the [place]. Tolerating well with no SE. REfills sent.

## 2010-08-10 ENCOUNTER — Ambulatory Visit (INDEPENDENT_AMBULATORY_CARE_PROVIDER_SITE_OTHER): Payer: BC Managed Care – PPO | Admitting: Family Medicine

## 2010-08-10 ENCOUNTER — Encounter: Payer: Self-pay | Admitting: Family Medicine

## 2010-08-10 DIAGNOSIS — M109 Gout, unspecified: Secondary | ICD-10-CM | POA: Insufficient documentation

## 2010-08-10 DIAGNOSIS — I10 Essential (primary) hypertension: Secondary | ICD-10-CM

## 2010-08-10 MED ORDER — METOPROLOL TARTRATE 50 MG PO TABS: 50.0000 mg | ORAL_TABLET | Freq: Two times a day (BID) | ORAL | Status: DC

## 2010-08-10 NOTE — Assessment & Plan Note (Signed)
BP still elevated. Will add a betablocker and f/u in 6 weeks.

## 2010-08-10 NOTE — Assessment & Plan Note (Addendum)
He gets red, hot swollen left great toe and ankle from time to time. He has never had a discussion with an MD about his gout.  He uses Aleve when he gets a flare and this does help. Says has been present for almost 14 years.  We discussed the use of allopurinol or Uloric for daily control and discussed how these meds work. Can also have inc flares the first 6 mo and will usually add daily dose of colchicine for control. He wants to think about this.  If start it will need to get a baseline uric acid level.

## 2010-08-10 NOTE — Patient Instructions (Signed)
Consider taking allopurinol daily for your gout.

## 2010-08-10 NOTE — Progress Notes (Signed)
  Subjective:    Patient ID: Christopher Lloyd, male    DOB: Oct 09, 1965, 45 y.o.   MRN: [id number]  Hypertension This is a chronic problem. The problem is unchanged. The problem is uncontrolled. Pertinent negatives include no chest pain, palpitations or shortness of breath. Agents associated with hypertension include NSAIDs (Aleve for his gout). Risk factors for coronary artery disease include male gender and obesity. Past treatments include ACE inhibitors and diuretics.      Review of Systems  Respiratory: Negative for shortness of breath.   Cardiovascular: Negative for chest pain and palpitations.       Objective:   Physical Exam  Constitutional: He is oriented to person, place, and time. He appears well-developed and well-nourished.  HENT:  Head: Normocephalic and atraumatic.  Eyes: Pupils are equal, round, and reactive to light.  Neck: No thyromegaly present.  Cardiovascular: Normal rate, regular rhythm and normal heart sounds.   Pulmonary/Chest: Effort normal and breath sounds normal.  Musculoskeletal:       Today now redness or swelling of the ankles or toes.   Lymphadenopathy:    He has no cervical adenopathy.  Neurological: He is alert and oriented to person, place, and time.  Skin: Skin is warm and dry.  Psychiatric: He has a normal mood and affect.          Assessment & Plan:

## 2010-08-16 ENCOUNTER — Encounter: Payer: Self-pay | Admitting: Family Medicine

## 2010-08-16 ENCOUNTER — Ambulatory Visit (INDEPENDENT_AMBULATORY_CARE_PROVIDER_SITE_OTHER): Payer: BC Managed Care – PPO | Admitting: Family Medicine

## 2010-08-16 DIAGNOSIS — M109 Gout, unspecified: Secondary | ICD-10-CM

## 2010-08-16 MED ORDER — COLCHICINE 0.6 MG PO TABS: 0.6000 mg | ORAL_TABLET | Freq: Every day | ORAL | Status: DC

## 2010-08-16 MED ORDER — PREDNISONE 20 MG PO TABS: ORAL_TABLET | ORAL | Status: DC

## 2010-08-16 MED ORDER — FEBUXOSTAT 40 MG PO TABS: 40.0000 mg | ORAL_TABLET | Freq: Every day | ORAL | Status: DC

## 2010-08-16 NOTE — Assessment & Plan Note (Signed)
Steroids for acute flare. Then will start Uloric (coupon card given) in addition to colchicine daily. Explained that with gout med often will flare in the first 6 months, then we can drop teh colchicine. Call if not better in one week.

## 2010-08-16 NOTE — Patient Instructions (Signed)
You can start the prednisone and the colchicine today.  Wait until this flare is over to start your Uloric

## 2010-08-16 NOTE — Progress Notes (Signed)
  Subjective:    Patient ID: Chip Canepa, male    DOB: 02/28/1966, 45 y.o.   MRN: [id number]  HPI Left foot great toe and 2nd and 3rd toes are painful red and swollen. Started 2 days ago but couldn't get a ride here yesteray. Took Aleve yesterday for the pain. Says really painful but was able to work today.  See previous note where we discussed allopurinol or Uloric. Pt isn't taking either one of these. He doesn't have a rescue med for his gout. Thought has  Been given steroids once before at an UC for this. He still hasn't gone to check his uric acid level. . Worse with walking. No real alleviating sxs.    Review of Systems     Objective:   Physical Exam  Skin:       Left foot with the great toe swollen and very red and hot.  Also affected are the 2nd and 3rd toes. Trace ankle edema.  Ankle is mildy tender but not red. The great toes is ver tender to touch           Assessment & Plan:

## 2010-08-17 ENCOUNTER — Telehealth: Payer: Self-pay | Admitting: Family Medicine

## 2010-08-17 LAB — COMPLETE METABOLIC PANEL WITH GFR
Chloride: 100 mEq/L (ref 96–112)
Creat: 0.92 mg/dL (ref 0.40–1.50)
GFR, Est African American: >60 mL/min (ref >60)
Potassium: 3.9 mEq/L (ref 3.5–5.3)
Sodium: 139 mEq/L (ref 135–145)
Total Protein: 7.2 g/dL (ref 6.0–8.3)

## 2010-08-17 NOTE — Telephone Encounter (Signed)
Consultation: Metabolic panel and uric acid level looked okay. When she starts feeling better please start the Uloric samples given. In followup in approximately 2 months and we'll recheck uric acid levels at that time.

## 2010-08-21 NOTE — Telephone Encounter (Signed)
Pt left message on  vm with results and dr. Vernie Ammons

## 2010-09-07 ENCOUNTER — Other Ambulatory Visit: Payer: Self-pay | Admitting: Family Medicine

## 2010-09-21 ENCOUNTER — Encounter: Payer: Self-pay | Admitting: Family Medicine

## 2010-09-21 ENCOUNTER — Ambulatory Visit (INDEPENDENT_AMBULATORY_CARE_PROVIDER_SITE_OTHER): Payer: BC Managed Care – PPO | Admitting: Family Medicine

## 2010-09-21 DIAGNOSIS — I10 Essential (primary) hypertension: Secondary | ICD-10-CM

## 2010-09-21 DIAGNOSIS — M109 Gout, unspecified: Secondary | ICD-10-CM

## 2010-09-21 MED ORDER — METOPROLOL TARTRATE 50 MG PO TABS: 50.0000 mg | ORAL_TABLET | Freq: Two times a day (BID) | ORAL | Status: DC

## 2010-09-21 MED ORDER — ZOLPIDEM TARTRATE 10 MG PO TABS: 10.0000 mg | ORAL_TABLET | Freq: Every evening | ORAL | Status: DC | PRN

## 2010-09-21 MED ORDER — FEBUXOSTAT 40 MG PO TABS: 80.0000 mg | ORAL_TABLET | Freq: Every day | ORAL | Status: DC

## 2010-09-21 NOTE — Progress Notes (Signed)
  Subjective:    Patient ID: Jaishaun Mcnab, male    DOB: 1965/04/21, 45 y.o.   MRN: [id number]  Hypertension This is a chronic problem. The problem is controlled. Pertinent negatives include no chest pain, orthopnea or peripheral edema. (Dizziness when stands up. ) There are no associated agents to hypertension. Past treatments include ACE inhibitors and diuretics. There are no compliance problems.   He has lost some weight. Says he has been working on this. He says he eats a low salt diet.   Gout- Doing well on uloric. Still taking his colchine daily.  Say only had one mild flare in his left elbow.  Says it was mild. No SE on the uloric. Will need a new rx sent over.     Review of Systems  Cardiovascular: Negative for chest pain and orthopnea.       Objective:   Physical Exam  Constitutional: He is oriented to person, place, and time. He appears well-developed and well-nourished.  HENT:  Head: Normocephalic and atraumatic.  Cardiovascular: Normal rate, regular rhythm and normal heart sounds.   Pulmonary/Chest: Effort normal and breath sounds normal.  Musculoskeletal: He exhibits no edema.  Neurological: He is alert and oriented to person, place, and time.  Skin: Skin is warm and dry.          Assessment & Plan:

## 2010-09-21 NOTE — Assessment & Plan Note (Signed)
Doing well. Uloric was refilled.  F?u in 4 months. If doing well at that point then can d/c the dialy colchicine.

## 2010-09-21 NOTE — Patient Instructions (Signed)
Continue to work on weight loss and exercise and healthy diet.

## 2010-09-21 NOTE — Assessment & Plan Note (Signed)
Much improved. At goal today. F/U in 4 mo. Conitnue to work on weight loss and diet.

## 2010-09-22 LAB — URIC ACID: Uric Acid, Serum: 6.1 mg/dL (ref 4.0–7.8)

## 2010-09-22 LAB — LIPID PANEL
Cholesterol: 177 mg/dL (ref 0–200)
LDL Cholesterol: 103 mg/dL — ABNORMAL HIGH (ref 0–99)
VLDL: 22 mg/dL (ref 0–40)

## 2010-09-24 ENCOUNTER — Telehealth: Payer: Self-pay | Admitting: Family Medicine

## 2010-09-24 NOTE — Telephone Encounter (Signed)
Call patient: Looks good except for LDL is 103 which is borderline. Goal is less than 100. His hemisphere. Continue work on healthy eating and exercise and let's recheck in one year. We just confirm with him that he is taking Uloric 80 mg.

## 2010-09-25 NOTE — Telephone Encounter (Signed)
Pt advised of results and states he is taking the Uloric.

## 2010-10-05 ENCOUNTER — Other Ambulatory Visit: Payer: Self-pay | Admitting: Family Medicine

## 2010-10-26 ENCOUNTER — Ambulatory Visit
Admission: RE | Admit: 2010-10-26 | Discharge: 2010-10-26 | Disposition: A | Discharge status: Home / Self Care | Payer: BC Managed Care – PPO | Source: Ambulatory Visit | Attending: Family Medicine | Admitting: Family Medicine

## 2010-10-26 ENCOUNTER — Other Ambulatory Visit: Payer: Self-pay | Admitting: Family Medicine

## 2010-10-26 ENCOUNTER — Encounter: Payer: Self-pay | Admitting: Family Medicine

## 2010-10-26 ENCOUNTER — Inpatient Hospital Stay (INDEPENDENT_AMBULATORY_CARE_PROVIDER_SITE_OTHER)
Admission: RE | Admit: 2010-10-26 | Discharge: 2010-10-26 | Disposition: A | Discharge status: Home / Self Care | Payer: BC Managed Care – PPO | Source: Ambulatory Visit | Attending: Family Medicine | Admitting: Family Medicine

## 2010-10-26 DIAGNOSIS — L02619 Cutaneous abscess of unspecified foot: Secondary | ICD-10-CM

## 2010-10-26 DIAGNOSIS — Z23 Encounter for immunization: Secondary | ICD-10-CM

## 2010-10-26 DIAGNOSIS — L03119 Cellulitis of unspecified part of limb: Secondary | ICD-10-CM

## 2010-10-26 DIAGNOSIS — S91309A Unspecified open wound, unspecified foot, initial encounter: Secondary | ICD-10-CM

## 2010-10-31 ENCOUNTER — Telehealth (INDEPENDENT_AMBULATORY_CARE_PROVIDER_SITE_OTHER): Payer: Self-pay | Admitting: Emergency Medicine

## 2010-12-20 ENCOUNTER — Other Ambulatory Visit: Payer: Self-pay | Admitting: Family Medicine

## 2011-02-01 DIAGNOSIS — Z0289 Encounter for other administrative examinations: Secondary | ICD-10-CM

## 2011-02-06 ENCOUNTER — Other Ambulatory Visit: Payer: Self-pay | Admitting: Family Medicine

## 2011-03-04 ENCOUNTER — Ambulatory Visit: Payer: BC Managed Care – PPO | Admitting: Family Medicine

## 2011-03-08 ENCOUNTER — Other Ambulatory Visit: Payer: Self-pay | Admitting: Family Medicine
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
## 2011-03-11 NOTE — Letter (Signed)
Summary: Out of Work  [facility]  [address]   [place], [place] [address]   Phone: [phone]  Fax: [phone]    October 26, 2010   Employee:  Christopher Lloyd    To Whom It May Concern:   For Medical reasons, please excuse the above named employee from work tomorrow and 10/27/10.    If you need additional information, please feel free to contact our office.         Sincerely,    Donna Christen MD

## 2011-03-11 NOTE — Telephone Encounter (Signed)
  Phone Note Outgoing Call Call back at [facility] Phone [phone]   Call placed by: Emilio Math,  October 31, 2010 2:16 PM Call placed to: Patient Summary of Call: Left message on answering machine, remember to finish antibiotic, call with questions or concerns.

## 2011-08-30 ENCOUNTER — Ambulatory Visit
Admission: RE | Admit: 2011-08-30 | Discharge: 2011-08-30 | Disposition: A | Discharge status: Home / Self Care | Payer: BC Managed Care – PPO | Source: Ambulatory Visit | Attending: Family Medicine | Admitting: Family Medicine

## 2011-08-30 ENCOUNTER — Encounter: Payer: Self-pay | Admitting: Family Medicine

## 2011-08-30 ENCOUNTER — Ambulatory Visit (INDEPENDENT_AMBULATORY_CARE_PROVIDER_SITE_OTHER): Payer: BC Managed Care – PPO | Admitting: Family Medicine

## 2011-08-30 VITALS — BP 165/106 | HR 83 | Ht 70.0 in | Wt 186.0 lb

## 2011-08-30 DIAGNOSIS — M545 Low back pain, unspecified: Secondary | ICD-10-CM

## 2011-08-30 DIAGNOSIS — M19049 Primary osteoarthritis, unspecified hand: Secondary | ICD-10-CM

## 2011-08-30 DIAGNOSIS — I10 Essential (primary) hypertension: Secondary | ICD-10-CM

## 2011-08-30 DIAGNOSIS — M21939 Unspecified acquired deformity of unspecified forearm: Secondary | ICD-10-CM

## 2011-08-30 DIAGNOSIS — G8929 Other chronic pain: Secondary | ICD-10-CM

## 2011-08-30 DIAGNOSIS — M21949 Unspecified acquired deformity of hand, unspecified hand: Secondary | ICD-10-CM

## 2011-08-30 DIAGNOSIS — M109 Gout, unspecified: Secondary | ICD-10-CM

## 2011-08-30 LAB — POCT URINALYSIS DIPSTICK
Blood, UA: NEGATIVE
Glucose, UA: NEGATIVE
Leukocytes, UA: NEGATIVE

## 2011-08-30 MED ORDER — METOPROLOL TARTRATE 50 MG PO TABS: 50.0000 mg | ORAL_TABLET | Freq: Two times a day (BID) | ORAL | Status: DC

## 2011-08-30 MED ORDER — OXYCODONE-ACETAMINOPHEN 5-325 MG PO TABS: 1.0000 | ORAL_TABLET | Freq: Every evening | ORAL | Status: DC | PRN

## 2011-08-30 MED ORDER — PREDNISONE 20 MG PO TABS: 40.0000 mg | ORAL_TABLET | Freq: Every day | ORAL | Status: AC

## 2011-08-30 MED ORDER — LISINOPRIL 40 MG PO TABS: 40.0000 mg | ORAL_TABLET | Freq: Every day | ORAL | Status: DC

## 2011-08-30 MED ORDER — ZOLPIDEM TARTRATE 10 MG PO TABS: 10.0000 mg | ORAL_TABLET | Freq: Every evening | ORAL | Status: DC | PRN
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
## 2011-08-30 NOTE — Patient Instructions (Signed)
Low Back Sprain with Rehab  A sprain is an injury in which a ligament is torn. The ligaments of the lower back are vulnerable to sprains. However, they are strong and require great force to be injured. These ligaments are important for stabilizing the spinal column. Sprains are classified into three categories. Grade 1 sprains cause pain, but the tendon is not lengthened. Grade 2 sprains include a lengthened ligament, due to the ligament being stretched or partially ruptured. With grade 2 sprains there is still function, although the function may be decreased. Grade 3 sprains involve a complete tear of the tendon or muscle, and function is usually impaired. SYMPTOMS   Severe pain in the lower back.   Sometimes, a feeling of a "pop," "snap," or tear, at the time of injury.   Tenderness and sometimes swelling at the injury site.   Uncommonly, bruising (contusion) within 48 hours of injury.   Muscle spasms in the back.  CAUSES  Low back sprains occur when a force is placed on the ligaments that is greater than they can handle. Common causes of injury include:  Performing a stressful act while off-balance.   Repetitive stressful activities that involve movement of the lower back.   Direct hit (trauma) to the lower back.  RISK INCREASES WITH:  Contact sports (football, wrestling).   Collisions (major skiing accidents).   Sports that require throwing or lifting (baseball, weightlifting).   Sports involving twisting of the spine (gymnastics, diving, tennis, golf).   Poor strength and flexibility.   Inadequate protection.   Previous back injury or surgery (especially fusion).  PREVENTION  Wear properly fitted and padded protective equipment.   Warm up and stretch properly before activity.   Allow for adequate recovery between workouts.   Maintain physical fitness:   Strength, flexibility, and endurance.   Cardiovascular fitness.   Maintain a healthy body weight.  PROGNOSIS   If treated properly, low back sprains usually heal with non-surgical treatment. The length of time for healing depends on the severity of the injury.  RELATED COMPLICATIONS   Recurring symptoms, resulting in a chronic problem.   Chronic inflammation and pain in the low back.   Delayed healing or resolution of symptoms, especially if activity is resumed too soon.   Prolonged impairment.   Unstable or arthritic joints of the low back.  TREATMENT  Treatment first involves the use of ice and medicine, to reduce pain and inflammation. The use of strengthening and stretching exercises may help reduce pain with activity. These exercises may be performed at home or with a therapist. Severe injuries may require referral to a therapist for further evaluation and treatment, such as ultrasound. Your caregiver may advise that you wear a back brace or corset, to help reduce pain and discomfort. Often, prolonged bed rest results in greater harm then benefit. Corticosteroid injections may be recommended. However, these should be reserved for the most serious cases. It is important to avoid using your back when lifting objects. At night, sleep on your back on a firm mattress, with a pillow placed under your knees. If non-surgical treatment is unsuccessful, surgery may be needed.  MEDICATION   If pain medicine is needed, nonsteroidal anti-inflammatory medicines (aspirin and ibuprofen), or other minor pain relievers (acetaminophen), are often advised.   Do not take pain medicine for 7 days before surgery.   Prescription pain relievers may be given, if your caregiver thinks they are needed. Use only as directed and only as much as you   need.   Ointments applied to the skin may be helpful.   Corticosteroid injections may be given by your caregiver. These injections should be reserved for the most serious cases, because they may only be given a certain number of times.  HEAT AND COLD  Cold treatment (icing)  should be applied for 10 to 15 minutes every 2 to 3 hours for inflammation and pain, and immediately after activity that aggravates your symptoms. Use ice packs or an ice massage.   Heat treatment may be used before performing stretching and strengthening activities prescribed by your caregiver, physical therapist, or athletic trainer. Use a heat pack or a warm water soak.  SEEK MEDICAL CARE IF:   Symptoms get worse or do not improve in 2 to 4 weeks, despite treatment.   You develop numbness or weakness in either leg.   You lose bowel or bladder function.   Any of the following occur after surgery: fever, increased pain, swelling, redness, drainage of fluids, or bleeding in the affected area.   New, unexplained symptoms develop. (Drugs used in treatment may produce side effects.)  EXERCISES  RANGE OF MOTION (ROM) AND STRETCHING EXERCISES - Low Back Sprain Most people with lower back pain will find that their symptoms get worse with excessive bending forward (flexion) or arching at the lower back (extension). The exercises that will help resolve your symptoms will focus on the opposite motion.  Your physician, physical therapist or athletic trainer will help you determine which exercises will be most helpful to resolve your lower back pain. Do not complete any exercises without first consulting with your caregiver. Discontinue any exercises which make your symptoms worse, until you speak to your caregiver. If you have pain, numbness or tingling which travels down into your buttocks, leg or foot, the goal of the therapy is for these symptoms to move closer to your back and eventually resolve. Sometimes, these leg symptoms will get better, but your lower back pain may worsen. This is often an indication of progress in your rehabilitation. Be very alert to any changes in your symptoms and the activities in which you participated in the 24 hours prior to the change. Sharing this information with your  caregiver will allow him or her to most efficiently treat your condition. These exercises may help you when beginning to rehabilitate your injury. Your symptoms may resolve with or without further involvement from your physician, physical therapist or athletic trainer. While completing these exercises, remember:   Restoring tissue flexibility helps normal motion to return to the joints. This allows healthier, less painful movement and activity.   An effective stretch should be held for at least 30 seconds.   A stretch should never be painful. You should only feel a gentle lengthening or release in the stretched tissue.  FLEXION RANGE OF MOTION AND STRETCHING EXERCISES: STRETCH - Flexion, Single Knee to Chest   Lie on a firm bed or floor with both legs extended in front of you.   Keeping one leg in contact with the floor, bring your opposite knee to your chest. Hold your leg in place by either grabbing behind your thigh or at your knee.   Pull until you feel a gentle stretch in your low back. Hold __________ seconds.   Slowly release your grasp and repeat the exercise with the opposite side.  Repeat __________ times. Complete this exercise __________ times per day.  STRETCH - Flexion, Double Knee to Chest  Lie on a firm bed or   floor with both legs extended in front of you.   Keeping one leg in contact with the floor, bring your opposite knee to your chest.   Tense your stomach muscles to support your back and then lift your other knee to your chest. Hold your legs in place by either grabbing behind your thighs or at your knees.   Pull both knees toward your chest until you feel a gentle stretch in your low back. Hold __________ seconds.   Tense your stomach muscles and slowly return one leg at a time to the floor.  Repeat __________ times. Complete this exercise __________ times per day.  STRETCH - Low Trunk Rotation  Lie on a firm bed or floor. Keeping your legs in front of you, bend  your knees so they are both pointed toward the ceiling and your feet are flat on the floor.   Extend your arms out to the side. This will stabilize your upper body by keeping your shoulders in contact with the floor.   Gently and slowly drop both knees together to one side until you feel a gentle stretch in your low back. Hold for __________ seconds.   Tense your stomach muscles to support your lower back as you bring your knees back to the starting position. Repeat the exercise to the other side.  Repeat __________ times. Complete this exercise __________ times per day  EXTENSION RANGE OF MOTION AND FLEXIBILITY EXERCISES: STRETCH - Extension, Prone on Elbows   Lie on your stomach on the floor, a bed will be too soft. Place your palms about shoulder width apart and at the height of your head.   Place your elbows under your shoulders. If this is too painful, stack pillows under your chest.   Allow your body to relax so that your hips drop lower and make contact more completely with the floor.   Hold this position for __________ seconds.   Slowly return to lying flat on the floor.  Repeat __________ times. Complete this exercise __________ times per day.  RANGE OF MOTION - Extension, Prone Press Ups  Lie on your stomach on the floor, a bed will be too soft. Place your palms about shoulder width apart and at the height of your head.   Keeping your back as relaxed as possible, slowly straighten your elbows while keeping your hips on the floor. You may adjust the placement of your hands to maximize your comfort. As you gain motion, your hands will come more underneath your shoulders.   Hold this position __________ seconds.   Slowly return to lying flat on the floor.  Repeat __________ times. Complete this exercise __________ times per day.  RANGE OF MOTION- Quadruped, Neutral Spine   Assume a hands and knees position on a firm surface. Keep your hands under your shoulders and your knees  under your hips. You may place padding under your knees for comfort.   Drop your head and point your tailbone toward the ground below you. This will round out your lower back like an angry cat. Hold this position for __________ seconds.   Slowly lift your head and release your tail bone so that your back sags into a large arch, like an old horse.   Hold this position for __________ seconds.   Repeat this until you feel limber in your low back.   Now, find your "sweet spot." This will be the most comfortable position somewhere between the two previous positions. This is your neutral spine.   Once you have found this position, tense your stomach muscles to support your low back.   Hold this position for __________ seconds.  Repeat __________ times. Complete this exercise __________ times per day.  STRENGTHENING EXERCISES - Low Back Sprain These exercises may help you when beginning to rehabilitate your injury. These exercises should be done near your "sweet spot." This is the neutral, low-back arch, somewhere between fully rounded and fully arched, that is your least painful position. When performed in this safe range of motion, these exercises can be used for people who have either a flexion or extension based injury. These exercises may resolve your symptoms with or without further involvement from your physician, physical therapist or athletic trainer. While completing these exercises, remember:   Muscles can gain both the endurance and the strength needed for everyday activities through controlled exercises.   Complete these exercises as instructed by your physician, physical therapist or athletic trainer. Increase the resistance and repetitions only as guided.   You may experience muscle soreness or fatigue, but the pain or discomfort you are trying to eliminate should never worsen during these exercises. If this pain does worsen, stop and make certain you are following the directions exactly.  If the pain is still present after adjustments, discontinue the exercise until you can discuss the trouble with your caregiver.  STRENGTHENING - Deep Abdominals, Pelvic Tilt   Lie on a firm bed or floor. Keeping your legs in front of you, bend your knees so they are both pointed toward the ceiling and your feet are flat on the floor.   Tense your lower abdominal muscles to press your low back into the floor. This motion will rotate your pelvis so that your tail bone is scooping upwards rather than pointing at your feet or into the floor.  With a gentle tension and even breathing, hold this position for __________ seconds. Repeat __________ times. Complete this exercise __________ times per day.  STRENGTHENING - Abdominals, Crunches   Lie on a firm bed or floor. Keeping your legs in front of you, bend your knees so they are both pointed toward the ceiling and your feet are flat on the floor. Cross your arms over your chest.   Slightly tip your chin down without bending your neck.   Tense your abdominals and slowly lift your trunk high enough to just clear your shoulder blades. Lifting higher can put excessive stress on the lower back and does not further strengthen your abdominal muscles.   Control your return to the starting position.  Repeat __________ times. Complete this exercise __________ times per day.  STRENGTHENING - Quadruped, Opposite UE/LE Lift   Assume a hands and knees position on a firm surface. Keep your hands under your shoulders and your knees under your hips. You may place padding under your knees for comfort.   Find your neutral spine and gently tense your abdominal muscles so that you can maintain this position. Your shoulders and hips should form a rectangle that is parallel with the floor and is not twisted.   Keeping your trunk steady, lift your right hand no higher than your shoulder and then your left leg no higher than your hip. Make sure you are not holding your  breath. Hold this position for __________ seconds.   Continuing to keep your abdominal muscles tense and your back steady, slowly return to your starting position. Repeat with the opposite arm and leg.  Repeat __________ times. Complete this exercise __________ times   per day.  STRENGTHENING - Abdominals and Quadriceps, Straight Leg Raise   Lie on a firm bed or floor with both legs extended in front of you.   Keeping one leg in contact with the floor, bend the other knee so that your foot can rest flat on the floor.   Find your neutral spine, and tense your abdominal muscles to maintain your spinal position throughout the exercise.   Slowly lift your straight leg off the floor about 6 inches for a count of 15, making sure to not hold your breath.   Still keeping your neutral spine, slowly lower your leg all the way to the floor.  Repeat this exercise with each leg __________ times. Complete this exercise __________ times per day. POSTURE AND BODY MECHANICS CONSIDERATIONS - Low Back Sprain Keeping correct posture when sitting, standing or completing your activities will reduce the stress put on different body tissues, allowing injured tissues a chance to heal and limiting painful experiences. The following are general guidelines for improved posture. Your physician or physical therapist will provide you with any instructions specific to your needs. While reading these guidelines, remember:  The exercises prescribed by your provider will help you have the flexibility and strength to maintain correct postures.   The correct posture provides the best environment for your joints to work. All of your joints have less wear and tear when properly supported by a spine with good posture. This means you will experience a healthier, less painful body.   Correct posture must be practiced with all of your activities, especially prolonged sitting and standing. Correct posture is as important when doing  repetitive low-stress activities (typing) as it is when doing a single heavy-load activity (lifting).  RESTING POSITIONS Consider which positions are most painful for you when choosing a resting position. If you have pain with flexion-based activities (sitting, bending, stooping, squatting), choose a position that allows you to rest in a less flexed posture. You would want to avoid curling into a fetal position on your side. If your pain worsens with extension-based activities (prolonged standing, working overhead), avoid resting in an extended position such as sleeping on your stomach. Most people will find more comfort when they rest with their spine in a more neutral position, neither too rounded nor too arched. Lying on a non-sagging bed on your side with a pillow between your knees, or on your back with a pillow under your knees will often provide some relief. Keep in mind, being in any one position for a prolonged period of time, no matter how correct your posture, can still lead to stiffness. PROPER SITTING POSTURE In order to minimize stress and discomfort on your spine, you must sit with correct posture. Sitting with good posture should be effortless for a healthy body. Returning to good posture is a gradual process. Many people can work toward this most comfortably by using various supports until they have the flexibility and strength to maintain this posture on their own. When sitting with proper posture, your ears will fall over your shoulders and your shoulders will fall over your hips. You should use the back of the chair to support your upper back. Your lower back will be in a neutral position, just slightly arched. You may place a small pillow or folded towel at the base of your lower back for  support.  When working at a desk, create an environment that supports good, upright posture. Without extra support, muscles tire, which leads to excessive   strain on joints and other tissues. Keep these  recommendations in mind: CHAIR:  A chair should be able to slide under your desk when your back makes contact with the back of the chair. This allows you to work closely.   The chair's height should allow your eyes to be level with the upper part of your monitor and your hands to be slightly lower than your elbows.  BODY POSITION  Your feet should make contact with the floor. If this is not possible, use a foot rest.   Keep your ears over your shoulders. This will reduce stress on your neck and low back.  INCORRECT SITTING POSTURES  If you are feeling tired and unable to assume a healthy sitting posture, do not slouch or slump. This puts excessive strain on your back tissues, causing more damage and pain. Healthier options include:  Using more support, like a lumbar pillow.   Switching tasks to something that requires you to be upright or walking.   Talking a brief walk.   Lying down to rest in a neutral-spine position.  PROLONGED STANDING WHILE SLIGHTLY LEANING FORWARD  When completing a task that requires you to lean forward while standing in one place for a long time, place either foot up on a stationary 2-4 inch high object to help maintain the best posture. When both feet are on the ground, the lower back tends to lose its slight inward curve. If this curve flattens (or becomes too large), then the back and your other joints will experience too much stress, tire more quickly, and can cause pain. CORRECT STANDING POSTURES Proper standing posture should be assumed with all daily activities, even if they only take a few moments, like when brushing your teeth. As in sitting, your ears should fall over your shoulders and your shoulders should fall over your hips. You should keep a slight tension in your abdominal muscles to brace your spine. Your tailbone should point down to the ground, not behind your body, resulting in an over-extended swayback posture.  INCORRECT STANDING POSTURES    Common incorrect standing postures include a forward head, locked knees and/or an excessive swayback. WALKING Walk with an upright posture. Your ears, shoulders and hips should all line-up. PROLONGED ACTIVITY IN A FLEXED POSITION When completing a task that requires you to bend forward at your waist or lean over a low surface, try to find a way to stabilize 3 out of 4 of your limbs. You can place a hand or elbow on your thigh or rest a knee on the surface you are reaching across. This will provide you more stability, so that your muscles do not tire as quickly. By keeping your knees relaxed, or slightly bent, you will also reduce stress across your lower back. CORRECT LIFTING TECHNIQUES DO :  Assume a wide stance. This will provide you more stability and the opportunity to get as close as possible to the object which you are lifting.   Tense your abdominals to brace your spine. Bend at the knees and hips. Keeping your back locked in a neutral-spine position, lift using your leg muscles. Lift with your legs, keeping your back straight.   Test the weight of unknown objects before attempting to lift them.   Try to keep your elbows locked down at your sides in order get the best strength from your shoulders when carrying an object.   Always ask for help when lifting heavy or awkward objects.  INCORRECT LIFTING TECHNIQUES DO   NOT:   Lock your knees when lifting, even if it is a small object.   Bend and twist. Pivot at your feet or move your feet when needing to change directions.   Assume that you can safely pick up even a paperclip without proper posture.  Document Released: 03/25/2005 Document Revised: 03/14/2011 Document Reviewed: 07/07/2008 [employer] Patient Information 2012 [employer]. 

## 2011-09-01 LAB — URINE CULTURE: Organism ID, Bacteria: NO GROWTH

## 2011-09-05 LAB — LIPID PANEL
HDL: 53 mg/dL (ref >39)
LDL Cholesterol: 80 mg/dL (ref 0–99)
Total CHOL/HDL Ratio: 3.2 Ratio
Triglycerides: 186 mg/dL — ABNORMAL HIGH (ref <150)

## 2011-09-05 LAB — COMPLETE METABOLIC PANEL WITH GFR
ALT: 26 U/L (ref 0–53)
AST: 26 U/L (ref 0–37)
Calcium: 9.1 mg/dL (ref 8.4–10.5)
GFR, Est African American: >89 mL/min
GFR, Est Non African American: >89 mL/min
Glucose, Bld: 112 mg/dL — ABNORMAL HIGH (ref 70–99)
Potassium: 4.3 mEq/L (ref 3.5–5.3)
Sodium: 140 mEq/L (ref 135–145)
Total Protein: 6.6 g/dL (ref 6.0–8.3)

## 2011-09-05 LAB — CBC
Platelets: 178 10*3/uL (ref 150–400)
WBC: 7 10*3/uL (ref 4.0–10.5)

## 2011-09-05 LAB — URIC ACID: Uric Acid, Serum: 6.8 mg/dL (ref 4.0–7.8)

## 2011-09-05 LAB — SEDIMENTATION RATE: Sed Rate: 3 mm/hr (ref 0–16)

## 2011-09-27 ENCOUNTER — Encounter: Payer: Self-pay | Admitting: Family Medicine

## 2011-09-27 ENCOUNTER — Ambulatory Visit (INDEPENDENT_AMBULATORY_CARE_PROVIDER_SITE_OTHER): Payer: BC Managed Care – PPO | Admitting: Family Medicine

## 2011-09-27 VITALS — BP 132/86 | HR 73 | Wt 234.0 lb

## 2011-09-27 DIAGNOSIS — E669 Obesity, unspecified: Secondary | ICD-10-CM

## 2011-09-27 DIAGNOSIS — I10 Essential (primary) hypertension: Secondary | ICD-10-CM

## 2011-09-27 DIAGNOSIS — M545 Low back pain, unspecified: Secondary | ICD-10-CM

## 2011-09-27 MED ORDER — OXYCODONE-ACETAMINOPHEN 5-325 MG PO TABS: 1.0000 | ORAL_TABLET | Freq: Every evening | ORAL | Status: AC | PRN

## 2011-09-27 NOTE — Progress Notes (Signed)
  Subjective:    Patient ID: Christopher Lloyd, male    DOB: 1966/01/12, 46 y.o.   MRN: [id number]  HPI HTN f/u- No CP or SOB.  Taking the metoprolol and lisinopril.  No SE on the medication. He has only has one day of taking the medication.  Chronic low back pain f/u- No relief with the steroids. Tried the home exercises with no relief.  History of neck surgery. He has tingling in his right hand on the third fourth and fifth digits. He has also been hyperreflexic on the lower extremities his neck surgery. I did review the x-ray results with him.   Review of Systems     Objective:   Physical Exam  Constitutional: He is oriented to person, place, and time. He appears well-developed and well-nourished.  HENT:  Head: Normocephalic and atraumatic.  Cardiovascular: Normal rate, regular rhythm and normal heart sounds.   Pulmonary/Chest: Effort normal and breath sounds normal.  Musculoskeletal:       He is very tender the lower lumbar spine. Negative straight leg is bilaterally. Knee strength is 5 over 5 bilaterally. Patellar reflexes are 2+ and hyperreflexic bilaterally.  Neurological: He is alert and oriented to person, place, and time.  Skin: Skin is warm and dry.  Psychiatric: He has a normal mood and affect. His behavior is normal.          Assessment & Plan:  HTN - well controlled. Continue current regimen. Tolerating the medication well without any side effects. Followup in 3 months.  Chronic low back pain - we discussed options of physical therapy versus referral to an orthopedist. We also discussed potentially getting an MRI. We will start with orthopedic referral and he will check with his insurance to see physical therapy is covered. He may need further evaluation with MRI but I will let orthopedics decide this. For now refill his Percocet and increase from 45 tabs for the month to 60 tabs for the month. Unfortunately the prednisone was not helpful at all. He is obese and this is  contributing. Did encourage weight loss.  Obesity-encourage weight loss.

## 2012-01-15 ENCOUNTER — Encounter: Payer: Self-pay | Admitting: Family Medicine

## 2012-01-15 ENCOUNTER — Ambulatory Visit (INDEPENDENT_AMBULATORY_CARE_PROVIDER_SITE_OTHER): Payer: BC Managed Care – PPO

## 2012-01-15 ENCOUNTER — Ambulatory Visit (INDEPENDENT_AMBULATORY_CARE_PROVIDER_SITE_OTHER): Payer: BC Managed Care – PPO | Admitting: Family Medicine

## 2012-01-15 ENCOUNTER — Other Ambulatory Visit: Payer: Self-pay | Admitting: Family Medicine

## 2012-01-15 VITALS — BP 134/88 | HR 71 | Ht 70.0 in | Wt 242.0 lb

## 2012-01-15 DIAGNOSIS — M25561 Pain in right knee: Secondary | ICD-10-CM

## 2012-01-15 DIAGNOSIS — I10 Essential (primary) hypertension: Secondary | ICD-10-CM

## 2012-01-15 DIAGNOSIS — M25569 Pain in unspecified knee: Secondary | ICD-10-CM

## 2012-01-15 DIAGNOSIS — M25469 Effusion, unspecified knee: Secondary | ICD-10-CM

## 2012-01-15 DIAGNOSIS — M109 Gout, unspecified: Secondary | ICD-10-CM

## 2012-01-15 MED ORDER — LISINOPRIL 40 MG PO TABS: 40.0000 mg | ORAL_TABLET | Freq: Every day | ORAL | Status: DC

## 2012-01-15 MED ORDER — ALLOPURINOL 100 MG PO TABS: 100.0000 mg | ORAL_TABLET | Freq: Every day | ORAL | Status: DC

## 2012-01-15 MED ORDER — TRAZODONE HCL 100 MG PO TABS: 50.0000 mg | ORAL_TABLET | Freq: Every day | ORAL | Status: DC

## 2012-01-15 NOTE — Progress Notes (Signed)
  Subjective:    Patient ID: Christopher Lloyd, male    DOB: [birth year], 46 y.o.   MRN: [id number]  HPI HTN- hypertension followup. No chest pain or short of breath. Taking his medications regularly and has not had any side effects or problems with the medication.  Right knee pain-he complains of right hip pain for several years. He says he just lives with it because her on a daily basis. It does swell at times. He is wearing a knee sleeve today. He is never had x-rays or injections or other workup. 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 In fact he had seen sports medicine for this and they have encouraged him to get an MRI because of the severity of the arthritis but unfortunately because he has a $2500 deductible he was able to go for the MRI.  Gout-he says he's been off the Uloric for almost a year. I did not realize that he had stopped this. He says he is doing well and has not had any recent flares.   Review of Systems     Objective:   Physical Exam  Constitutional: He is oriented to person, place, and time. He appears well-developed and well-nourished.  HENT:  Head: Normocephalic and atraumatic.  Cardiovascular: Normal rate, regular rhythm and normal heart sounds.   Pulmonary/Chest: Effort normal and breath sounds normal.  Neurological: He is alert and oriented to person, place, and time.  Skin: Skin is warm and dry.  Psychiatric: He has a normal mood and affect. His behavior is normal.          Assessment & Plan:  HTN- well controlled today. Continue current regimen. Refills sent for lisinopril. Follow up in 6 months.  Gout-I discussed the importance of being on some type of treatment. He says he stopped the Uloric because it was expensive. Thus we will change his allopurinol and start with 100 mg dose. We can recheck his uric acid level when I see him back. I explained the importance of treating this or that does not cause destruction of the joints.  Right knee pain-I.  did not actually examine his knee as this was mentioned at the end of the office visit. Because he has not had any workup and he does have a history of severe OA in his back and I recommend starting at least plain film x-rays. Depending on what we find I might refer him to Dr. Benjamin Stain for further evaluation workup. He could potentially get some pain relief and benefit from injections. I explained to him that this would not be permanent but certainly could improve his quality of life.  Declines flu vaccine today.

## 2012-01-17 ENCOUNTER — Encounter: Payer: Self-pay | Admitting: Sports Medicine

## 2012-01-17 ENCOUNTER — Ambulatory Visit (INDEPENDENT_AMBULATORY_CARE_PROVIDER_SITE_OTHER): Payer: BC Managed Care – PPO | Admitting: Sports Medicine

## 2012-01-17 VITALS — BP 137/83 | HR 80 | Wt 246.0 lb

## 2012-01-17 DIAGNOSIS — M545 Low back pain, unspecified: Secondary | ICD-10-CM

## 2012-01-17 DIAGNOSIS — G8929 Other chronic pain: Secondary | ICD-10-CM

## 2012-01-17 DIAGNOSIS — IMO0001 Reserved for inherently not codable concepts without codable children: Secondary | ICD-10-CM

## 2012-01-17 NOTE — Progress Notes (Signed)
Subjective:    I'm seeing this patient as a consultation for:  Dr. Linford Arnold  CC: Back pain, right knee pain  HPI: Christopher Lloyd really wants to talk about his back pain today. He agrees that his right knee pain currently. He's had a long history, with years of pain he localizes in the midline of his upper lumbar spine. This is worse with extension, and somewhat better with flexion. He has seen orthopedics, x-rays were done that showed L5-S1 facet arthrosis, and they recommended MRI. Unfortunately he was unable to afford this. He has never done physical therapy, and he has never had trigger point injections. The pain is localized, does not radiate down his legs. He denies any recent trauma.  Past medical history, Surgical history, Family history, Social history, Allergies, and medications have been entered into the medical record, reviewed, and no changes needed.   Review of Systems: No headache, visual changes, nausea, vomiting, diarrhea, constipation, dizziness, abdominal pain, skin rash, fevers, chills, night sweats, weight loss, swollen lymph nodes, body aches, joint swelling, muscle aches, chest pain, or shortness of breath.   Objective:   Vitals:  Afebrile, vital signs stable. General: Well Developed, well nourished, and in no acute distress.  Neuro/Psych: Alert and oriented x3, extra-ocular muscles intact, able to move all 4 extremities.  Skin: Warm and dry, no rashes noted.  Respiratory: Not using accessory muscles, speaking in full sentences, trachea midline.  Cardiovascular: Pulses palpable, no extremity edema. Abdomen: Does not appear distended. Back Exam:  Inspection: Unremarkable  Motion: Flexion 45 deg, Extension 45 deg, Side Bending to 45 deg bilaterally,  Rotation to 45 deg bilaterally  SLR laying: Negative  XSLR laying: Negative  Palpable tenderness: In the midline along the mid lumbar spine. FABER: negative. Sensory change: Gross sensation intact to all lumbar and sacral  dermatomes.  Reflexes: 2+ at both patellar tendons, 2+ at achilles tendons, Babinski's downgoing.  Strength at foot  Plantar-flexion: 5/5 Dorsi-flexion: 5/5 Eversion: 5/5 Inversion: 5/5  Leg strength  Quad: 5/5 Hamstring: 5/5 Hip flexor: 5/5 Hip abductors: 5/5  Gait unremarkable.  Procedure:  Injection of trigger point at mid lumbar midline Consent obtained and verified. Time-out conducted. Noted no overlying erythema, induration, or other signs of local infection. Skin prepped in a sterile fashion. Topical analgesic spray: Ethyl chloride. Completed without difficulty. Meds: 1 cc Kenalog 40, 2 cc lidocaine injected easily. Pain immediately improved suggesting accurate placement of the medication. Advised to call if fevers/chills, erythema, induration, drainage, or persistent bleeding.   Impression and Recommendations:   This case required medical decision making of moderate complexity.

## 2012-01-17 NOTE — Assessment & Plan Note (Addendum)
Based on clinical exam, and x-rays pain seems to facetogenic. Trigger point injection given today. He will do home rehabilitation. He will come back to see me in 3 weeks, he cannot afford MRI, but x-rays showed fairly clear L5-S1 facet arthrosis. This would likely be amenable to interventional facet joint injection by radiology.  We will discuss this at the next visit. He does work on concrete all day, I do think he would benefit from pushing orthotics at some point.

## 2012-02-10 ENCOUNTER — Encounter: Payer: Self-pay | Admitting: *Deleted

## 2012-02-10 ENCOUNTER — Ambulatory Visit (INDEPENDENT_AMBULATORY_CARE_PROVIDER_SITE_OTHER): Payer: BC Managed Care – PPO | Admitting: Sports Medicine

## 2012-02-10 ENCOUNTER — Encounter: Payer: Self-pay | Admitting: Sports Medicine

## 2012-02-10 VITALS — BP 139/89 | HR 79 | Wt 238.0 lb

## 2012-02-10 DIAGNOSIS — IMO0002 Reserved for concepts with insufficient information to code with codable children: Secondary | ICD-10-CM

## 2012-02-10 DIAGNOSIS — M545 Low back pain, unspecified: Secondary | ICD-10-CM

## 2012-02-10 DIAGNOSIS — G8929 Other chronic pain: Secondary | ICD-10-CM

## 2012-02-10 DIAGNOSIS — M1711 Unilateral primary osteoarthritis, right knee: Secondary | ICD-10-CM | POA: Insufficient documentation

## 2012-02-10 DIAGNOSIS — M1710 Unilateral primary osteoarthritis, unspecified knee: Secondary | ICD-10-CM

## 2012-02-10 MED ORDER — TRAMADOL HCL 50 MG PO TABS: 50.0000 mg | ORAL_TABLET | Freq: Four times a day (QID) | ORAL | Status: DC | PRN

## 2012-02-10 NOTE — Progress Notes (Signed)
SPORTS MEDICINE CONSULTATION REPORT   Subjective:    CC: Followup  HPI: Low back pain: Has been present for years, I saw him approximately a month ago, and performed a trigger point injection. Overall his pain significantly improved, however is returning. He again, localizes the pain in the mid, midline lumbar spine. It is worse with extension. He has been unable to get an MRI due to his out-of-pocket cost of over $600. We do have x-rays that show moderate to severe L5-S1 facet arthropathy bilaterally, and a grade 1 anterolisthesis of L5 on S1. He has already had several forms of rehabilitation exercises. His pain is axial, and does not radiate.  Right knee pain: X-rays recently showed mild tricompartmental DJD with a possible loose body intra-articularly. He has failed oral medications, and has failed compression. He's never had an injection.  Has pain but no mechanical symptoms at the joint lines.  Past medical history, Surgical history, Family history, Social history, Allergies, and medications have been entered into the medical record, reviewed, and no changes needed.   Review of Systems: No headache, visual changes, nausea, vomiting, diarrhea, constipation, dizziness, abdominal pain, skin rash, fevers, chills, night sweats, weight loss, swollen lymph nodes, body aches, joint swelling, muscle aches, chest pain, or shortness of breath.   Objective:   Vitals:  Afebrile, vital signs stable. General: Well Developed, well nourished, and in no acute distress.  Neuro/Psych: Alert and oriented x3, extra-ocular muscles intact, able to move all 4 extremities.  Skin: Warm and dry, no rashes noted.  Respiratory: Not using accessory muscles, speaking in full sentences, trachea midline.  Cardiovascular: Pulses palpable, no extremity edema. Abdomen: Does not appear distended. Right Knee: Normal to inspection with no erythema or effusion or obvious bony abnormalities. Tender to palpation at the joint  lines. ROM full in flexion and extension and lower leg rotation. Ligaments with solid consistent endpoints including ACL, PCL, LCL, MCL. Negative Mcmurray's, Apley's, and Thessalonian tests. Non painful patellar compression. Patellar glide without crepitus. Patellar and quadriceps tendons unremarkable. Hamstring and quadriceps strength is normal.   Back Exam:  Inspection: Unremarkable  Motion: Flexion 45 deg, Extension 45 deg, Side Bending to 45 deg bilaterally,  Rotation to 45 deg bilaterally pain with extension..  SLR laying: Negative  XSLR laying: Negative  Palpable tenderness: None. FABER: negative. Sensory change: Gross sensation intact to all lumbar and sacral dermatomes.  Reflexes: 2+ at both patellar tendons, 2+ at achilles tendons, Babinski's downgoing.  Strength at foot  Plantar-flexion: 5/5 Dorsi-flexion: 5/5 Eversion: 5/5 Inversion: 5/5  Leg strength  Quad: 5/5 Hamstring: 5/5 Hip flexor: 5/5 Hip abductors: 5/5  Gait unremarkable.  Procedure: Real-time Ultrasound Guided Injection of  right knee  Device: GE Logiq E  Ultrasound guided injection is preferred based studies that show increased duration, increased effect, greater accuracy, decreased procedural pain, increased response rate, and decreased cost with ultrasound guided versus blind injection.  Verbal informed consent obtained.  Time-out conducted.  Noted no overlying erythema, induration, or other signs of local infection.  Skin prepped in a sterile fashion.  Local anesthesia: Topical Ethyl chloride.  With sterile technique and under real time ultrasound guidance:   2 cc Kenalog 40, 4 cc lidocaine injected into suprapatellar recess.  Completed without difficulty  Pain immediately resolved suggesting accurate placement of the medication.  Advised to call if fevers/chills, erythema, induration, drainage, or persistent bleeding.  Images permanently stored and available for review in the ultrasound unit.    Impression: Technically successful ultrasound guided  injection.  Impression and Recommendations:   This case required medical decision making of moderate complexity.

## 2012-02-10 NOTE — Assessment & Plan Note (Addendum)
Better but not resolved status post a single trigger point injection, or rehabilitation, and oral medications. X-ray did show L5-S1 facet arthrosis bilaterally with a small amount of grade 1 anterolisthesis. His pain is worse with extension, this further suggests that his pain is facetogenic. Unfortunately, he cannot afford an MRI. As I do think that his x-rays findings are suggestive, I think he would benefit from at least a diagnostic/therapeutic L5-S1 bilateral injection, I would like him to see Dr. Oneal Grout for consideration of this. We did discuss that she may want an MRI first, however unfortunately he cannot afford this, I hope that she can try the injection without an MRI. I will bridge him with tramadol in the meantime.

## 2012-02-10 NOTE — Assessment & Plan Note (Signed)
Joint injected as above. Unfortunately, he cannot afford an MRI. We will see how he does after the injection before consideration of surgical management.

## 2012-02-18 ENCOUNTER — Encounter: Payer: Self-pay | Admitting: Sports Medicine

## 2012-02-18 ENCOUNTER — Ambulatory Visit (INDEPENDENT_AMBULATORY_CARE_PROVIDER_SITE_OTHER): Payer: BC Managed Care – PPO | Admitting: Sports Medicine

## 2012-02-18 VITALS — BP 137/87 | HR 98 | Temp 98.5°F | Wt 239.0 lb

## 2012-02-18 DIAGNOSIS — M1711 Unilateral primary osteoarthritis, right knee: Secondary | ICD-10-CM

## 2012-02-18 DIAGNOSIS — R269 Unspecified abnormalities of gait and mobility: Secondary | ICD-10-CM

## 2012-02-18 DIAGNOSIS — G8929 Other chronic pain: Secondary | ICD-10-CM

## 2012-02-18 NOTE — Assessment & Plan Note (Signed)
Not improved after injection. Is unable to afford an MRI, or orthopedist referral to consider arthroscopy. We will try to help his pain with orthotics. I would not use narcotics, or further injections.

## 2012-02-18 NOTE — Progress Notes (Signed)
Patient was fitted for a : standard, cushioned, semi-rigid orthotic. The orthotic was heated and afterward the patient stood on the orthotic blank positioned on the orthotic stand. The patient was positioned in subtalar neutral position and 10 degrees of ankle dorsiflexion in a weight bearing stance. After completion of molding, a stable base was applied to the orthotic blank. The blank was ground to a stable position for weight bearing. Size: 13 Base: Blue EVA Additional Posting and Padding: None The patient ambulated these, and they were very comfortable.

## 2012-02-18 NOTE — Assessment & Plan Note (Signed)
He does have an appointment with Dr. Oneal Grout for consideration of facet joint injections in December.

## 2012-02-18 NOTE — Assessment & Plan Note (Signed)
Custom orthotics as above. Return to see me after injections.

## 2012-03-09 ENCOUNTER — Other Ambulatory Visit: Payer: Self-pay | Admitting: Family Medicine

## 2012-06-18 ENCOUNTER — Ambulatory Visit (INDEPENDENT_AMBULATORY_CARE_PROVIDER_SITE_OTHER): Payer: BC Managed Care – PPO | Admitting: Sports Medicine

## 2012-06-18 ENCOUNTER — Other Ambulatory Visit: Payer: Self-pay | Admitting: Sports Medicine

## 2012-06-18 ENCOUNTER — Encounter: Payer: Self-pay | Admitting: Sports Medicine

## 2012-06-18 VITALS — BP 138/89 | HR 96 | Wt 249.0 lb

## 2012-06-18 DIAGNOSIS — M76899 Other specified enthesopathies of unspecified lower limb, excluding foot: Secondary | ICD-10-CM

## 2012-06-18 DIAGNOSIS — M7052 Other bursitis of knee, left knee: Secondary | ICD-10-CM

## 2012-06-18 DIAGNOSIS — M7050 Other bursitis of knee, unspecified knee: Secondary | ICD-10-CM | POA: Insufficient documentation

## 2012-06-18 MED ORDER — OXYCODONE-ACETAMINOPHEN 10-325 MG PO TABS: 1.0000 | ORAL_TABLET | Freq: Three times a day (TID) | ORAL | Status: DC | PRN

## 2012-06-18 MED ORDER — DOXYCYCLINE HYCLATE 100 MG PO TABS: 100.0000 mg | ORAL_TABLET | Freq: Two times a day (BID) | ORAL | Status: AC

## 2012-06-18 NOTE — Progress Notes (Addendum)
  Subjective:    Referring provider: Dr. Eppie Gibson.  CC: Left knee pain  HPI: Left knee: Over the past day Christopher Lloyd has noted increasing pain, swelling, redness, and more over the anterior aspect of his left knee without trauma. He does have a history of gout. The pain and swelling is localized, doesn't radiate, is very severe. He denies any constitutional symptoms.  Past medical history, Surgical history, Family history not pertinant except as noted below, Social history, Allergies, and medications have been entered into the medical record, reviewed, and no changes needed.   Review of Systems: No headache, visual changes, nausea, vomiting, diarrhea, constipation, dizziness, abdominal pain, skin rash, fevers, chills, night sweats, weight loss, swollen lymph nodes, body aches, joint swelling, muscle aches, chest pain, shortness of breath, mood changes, visual or auditory hallucinations.   Objective:   General: Well Developed, well nourished, and in no acute distress.  Neuro/Psych: Alert and oriented x3, extra-ocular muscles intact, able to move all 4 extremities, sensation grossly intact. Skin: Warm and dry, no rashes noted.  Respiratory: Not using accessory muscles, speaking in full sentences, trachea midline.  Cardiovascular: Pulses palpable, no extremity edema. Abdomen: Does not appear distended. Left Knee: Diffuse swelling with some erythema over the anterior knee. Exquisite tenderness to palpation with bogginess, and warmth over the anterior patella suggestive of prepatellar bursitis. ROM full in flexion and extension and lower leg rotation. Ligaments with solid consistent endpoints including ACL, PCL, LCL, MCL. Negative Mcmurray's, Apley's, and Thessalonian tests. Non painful patellar compression. Patellar glide without crepitus. Patellar and quadriceps tendons unremarkable. Hamstring and quadriceps strength is normal.   Procedure: Real-time Ultrasound Guided aspiration/injection of  left prepatellar bursa.  Device: GE Logiq E  Ultrasound guided injection is preferred based studies that show increased duration, increased effect, greater accuracy, decreased procedural pain, increased response rate, and decreased cost with ultrasound guided versus blind injection.  Verbal informed consent obtained.  Time-out conducted.  Noted no overlying erythema, induration, or other signs of local infection.  Skin prepped in a sterile fashion.  Local anesthesia: Topical Ethyl chloride.  With sterile technique and under real time ultrasound guidance:  4 cc lidocaine with epinephrine infiltrated subcutaneously and the anterior knee. 18-gauge needle on a 60 cc syringe advanced under real-time guidance into the prepatellar bursa which appeared very large, well-defined, and inflamed. Approximately 7 cc of straw-colored, transparent fluid aspirated. Syringe switched and 1 cc Kenalog 40, 2 cc lidocaine injected. The knee was then strapped with compressive bandage. Fluid was sent off for culture, sensitivity, glucose, Gram stain, crystal analysis. Completed without difficulty  Pain immediately resolved suggesting accurate placement of the medication.  Advised to call if fevers/chills, erythema, induration, drainage, or persistent bleeding.  Images permanently stored and available for review in the ultrasound unit.  Impression: Technically successful ultrasound guided aspiration/injection of inflamed and likely gouty prepatellar bursitis.  Impression and Recommendations:   This case required medical decision making of moderate complexity.

## 2012-06-18 NOTE — Assessment & Plan Note (Addendum)
Aspiration, straw-colored fluid. Does not appear septic. I am going to cover with doxycycline until culture results are back. Injected with Kenalog. Sent off for culture, sensitivity, as well as crystal analysis with history of gout. Return in 2 weeks. Knee was strapped with compressive bandage.

## 2012-06-18 NOTE — Addendum Note (Signed)
Addended by: Monica Becton on: 06/18/2012 05:47 PM   Modules accepted: Level of Service

## 2012-06-18 NOTE — Addendum Note (Signed)
Addended by: Monica Becton on: 06/18/2012 05:49 PM   Modules accepted: Level of Service

## 2012-06-19 LAB — SYNOVIAL CELL COUNT + DIFF, W/ CRYSTALS
Crystals, Fluid: NONE SEEN
Eosinophils-Synovial: 0 % (ref 0–1)
Lymphocytes-Synovial Fld: 0 % (ref 0–20)
Monocyte/Macrophage: 11 % — ABNORMAL LOW (ref 50–90)
Neutrophil, Synovial: 89 % — ABNORMAL HIGH (ref 0–25)
WBC, Synovial: 1930 uL — ABNORMAL HIGH (ref 0–200)

## 2012-06-19 LAB — GLUCOSE, SYNOVIAL FLUID: Glucose, Synovial Fluid: 101 mg/dL

## 2012-06-22 LAB — BODY FLUID CULTURE
Gram Stain: NONE SEEN
Organism ID, Bacteria: NO GROWTH

## 2012-07-02 ENCOUNTER — Ambulatory Visit: Payer: BC Managed Care – PPO | Admitting: Sports Medicine

## 2012-09-04 ENCOUNTER — Ambulatory Visit (HOSPITAL_BASED_OUTPATIENT_CLINIC_OR_DEPARTMENT_OTHER)
Admission: RE | Admit: 2012-09-04 | Discharge: 2012-09-04 | Disposition: A | Discharge status: Home / Self Care | Payer: BC Managed Care – PPO | Source: Ambulatory Visit | Attending: Family Medicine | Admitting: Family Medicine

## 2012-09-04 ENCOUNTER — Ambulatory Visit (INDEPENDENT_AMBULATORY_CARE_PROVIDER_SITE_OTHER): Payer: BC Managed Care – PPO | Admitting: Family Medicine

## 2012-09-04 ENCOUNTER — Encounter: Payer: Self-pay | Admitting: Family Medicine

## 2012-09-04 VITALS — BP 128/75 | HR 82 | Wt 235.0 lb

## 2012-09-04 DIAGNOSIS — R109 Unspecified abdominal pain: Secondary | ICD-10-CM

## 2012-09-04 DIAGNOSIS — R112 Nausea with vomiting, unspecified: Secondary | ICD-10-CM

## 2012-09-04 DIAGNOSIS — R1011 Right upper quadrant pain: Secondary | ICD-10-CM | POA: Insufficient documentation

## 2012-09-04 DIAGNOSIS — R197 Diarrhea, unspecified: Secondary | ICD-10-CM

## 2012-09-04 DIAGNOSIS — Z9089 Acquired absence of other organs: Secondary | ICD-10-CM | POA: Insufficient documentation

## 2012-09-04 LAB — CBC WITH DIFFERENTIAL/PLATELET
HCT: 47.3 % (ref 39.0–52.0)
Hemoglobin: 16.2 g/dL (ref 13.0–17.0)
Lymphocytes Relative: 20 % (ref 12–46)
Lymphs Abs: 1.8 10*3/uL (ref 0.7–4.0)
MCHC: 34.3 g/dL (ref 30.0–36.0)
Monocytes Absolute: 0.8 10*3/uL (ref 0.1–1.0)
Monocytes Relative: 8 % (ref 3–12)
Neutro Abs: 6 10*3/uL (ref 1.7–7.7)
Neutrophils Relative %: 65 % (ref 43–77)
RBC: 4.75 MIL/uL (ref 4.22–5.81)
WBC: 9.2 10*3/uL (ref 4.0–10.5)

## 2012-09-04 MED ORDER — PROMETHAZINE HCL 25 MG PO TABS: 25.0000 mg | ORAL_TABLET | Freq: Four times a day (QID) | ORAL | Status: DC | PRN

## 2012-09-04 MED ORDER — IOHEXOL 300 MG/ML  SOLN
100.0000 mL | Freq: Once | INTRAMUSCULAR | Status: AC | PRN
  Administered 2012-09-04: 100 mL via INTRAVENOUS

## 2012-09-04 NOTE — Progress Notes (Signed)
Subjective:    Patient ID: Christopher Lloyd, male    DOB: 10-Jul-1965, 47 y.o.   MRN: [id number]  HPI Diarrhea and vomiting since Wednesday, 2 days. He did eat out initially. He and a friend ate the same males and his friend has not been sick. He denies any fever. Diarrhea has actually gotten a little bit better today. Vomited twice today. Some pain on his right flank.  Has had intermittant sharp  On the right side that will hit on and off for 2-3 months. Has been happening more often the last 2 days. When hits will last 1-2 minutes and then will hits it is really intense and wants to vomit.  No blood in the stool or vomit. No abdominal pain. No urinarry sxs. No meds taken for diarrhea.  No travel or camping.  Not visitn healthcare facilities like nursing home. He has had a cholecystectomy and appendectomy.   Review of Systems BP 128/75  Pulse 82  Wt 235 lb (106.595 kg)  BMI 33.72 kg/m2    No Known Allergies  Past Medical History  Diagnosis Date  . 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 Neck surgery, cervical disckectomy  11-03  . Cholecystectomy  11-95  . Appendectomy      History   Social History  . Marital Status: Divorced    Spouse Name: N/A    Number of Children: N/A  . Years of Education: N/A   Occupational History  . Not on file.   Social History Main Topics  . Smoking status: Never Smoker   . Smokeless tobacco: Not on file  . Alcohol Use: Yes  . Drug Use: No  . Sexually Active: Not on file   Other Topics Concern  . Not on file   Social History Narrative  . No narrative on file    Family History  Problem Relation Age of Onset  . Cancer Mother   . Cancer Other     brain  . Leukemia Other   . Hypertension Other     Outpatient Encounter Prescriptions as of 09/04/2012  Medication Sig Dispense Refill  . lisinopril (PRINIVIL,ZESTRIL) 40 MG tablet Take 1 tablet (40 mg total) by mouth daily.  30 tablet  6   . [DISCONTINUED] traZODone (DESYREL) 100 MG tablet TAKE 0.5-2 TABLETS (50-200 MG TOTAL) BY MOUTH AT BEDTIME.  60 tablet  0  . metoprolol (LOPRESSOR) 50 MG tablet Take 1 tablet (50 mg total) by mouth 2 (two) times daily.  60 tablet  6  . promethazine (PHENERGAN) 25 MG tablet Take 1 tablet (25 mg total) by mouth every 6 (six) hours as needed for nausea.  30 tablet  0  . [DISCONTINUED] allopurinol (ZYLOPRIM) 100 MG tablet Take 1 tablet (100 mg total) by mouth daily.  30 tablet  11  . [DISCONTINUED] colchicine 0.6 MG tablet Take 0.6 mg by mouth daily.      . [DISCONTINUED] oxyCODONE-acetaminophen (PERCOCET) 10-325 MG per tablet Take 1 tablet by mouth every 8 (eight) hours as needed for pain.  40 tablet  0   No facility-administered encounter medications on file as of 09/04/2012.          Objective:   Physical Exam  Constitutional: He is oriented to person, place, and time. He appears well-developed and well-nourished.  HENT:  Head: Normocephalic and atraumatic.  Mouth/Throat: Oropharynx is clear and moist.  Cardiovascular: Normal rate, regular rhythm and  normal heart sounds.   Pulmonary/Chest: Effort normal and breath sounds normal.  Abdominal: Soft. Bowel sounds are normal. He exhibits no distension and no mass. There is tenderness. There is no rebound and no guarding.  Tender in the right upper quadrant. No rebound or guarding.  Neurological: He is alert and oriented to person, place, and time.  Skin: Skin is warm and dry.  Psychiatric: He has a normal mood and affect. His behavior is normal.          Assessment & Plan:  Right-sided abdominal pain with vomiting and diarrhea. I'm concerned he's had this pain on and off for approximately 2 months it has gotten worse and now he is vomiting and diarrhea. These could BE to complete a separate issues but I do think that they could be related. He had Re: had a cholecystectomy and appendectomy. He's very tender in the right upper quadrant  and complaints of right-sided pain on the flank intermittently. I would like to move forward with a CT of the abdomen and pelvis with contrast. Also consider kidney stone. He has not noticed any hematuria. Will get a stat CBC with differential today. He denied feeling nauseated at the moment. Consider a prescription for Phenergan to use as needed for nausea.

## 2012-09-07 ENCOUNTER — Other Ambulatory Visit: Payer: Self-pay | Admitting: Family Medicine

## 2012-09-07 DIAGNOSIS — R197 Diarrhea, unspecified: Secondary | ICD-10-CM

## 2012-09-25 ENCOUNTER — Emergency Department (INDEPENDENT_AMBULATORY_CARE_PROVIDER_SITE_OTHER)
Admission: EM | Admit: 2012-09-25 | Discharge: 2012-09-25 | Disposition: A | Discharge status: Home / Self Care | Payer: BC Managed Care – PPO | Source: Home / Self Care | Attending: Family Medicine | Admitting: Family Medicine

## 2012-09-25 ENCOUNTER — Ambulatory Visit (INDEPENDENT_AMBULATORY_CARE_PROVIDER_SITE_OTHER): Payer: BC Managed Care – PPO | Admitting: Sports Medicine

## 2012-09-25 ENCOUNTER — Encounter: Payer: Self-pay | Admitting: *Deleted

## 2012-09-25 DIAGNOSIS — R509 Fever, unspecified: Secondary | ICD-10-CM

## 2012-09-25 DIAGNOSIS — M76899 Other specified enthesopathies of unspecified lower limb, excluding foot: Secondary | ICD-10-CM

## 2012-09-25 DIAGNOSIS — M25562 Pain in left knee: Secondary | ICD-10-CM

## 2012-09-25 DIAGNOSIS — M25569 Pain in unspecified knee: Secondary | ICD-10-CM

## 2012-09-25 DIAGNOSIS — M7052 Other bursitis of knee, left knee: Secondary | ICD-10-CM

## 2012-09-25 LAB — POCT CBC W AUTO DIFF (K'VILLE URGENT CARE)

## 2012-09-25 MED ORDER — PREDNISONE 10 MG PO TABS: ORAL_TABLET | ORAL | Status: DC

## 2012-09-25 MED ORDER — MELOXICAM 15 MG PO TABS: ORAL_TABLET | ORAL | Status: DC

## 2012-09-25 MED ORDER — CEFTRIAXONE SODIUM 1 G IJ SOLR
1.0000 g | Freq: Once | INTRAMUSCULAR | Status: AC
  Administered 2012-09-25: 1 g via INTRAMUSCULAR

## 2012-09-25 MED ORDER — KETOROLAC TROMETHAMINE 30 MG/ML IJ SOLN
30.0000 mg | Freq: Once | INTRAMUSCULAR | Status: AC
  Administered 2012-09-25: 30 mg via INTRAMUSCULAR

## 2012-09-25 MED ORDER — OXYCODONE-ACETAMINOPHEN 5-325 MG PO TABS: 1.0000 | ORAL_TABLET | Freq: Three times a day (TID) | ORAL | Status: DC | PRN

## 2012-09-25 MED ORDER — DOXYCYCLINE HYCLATE 100 MG PO TABS: 100.0000 mg | ORAL_TABLET | Freq: Two times a day (BID) | ORAL | Status: AC

## 2012-09-25 NOTE — Assessment & Plan Note (Signed)
This has returned 3 months after the last aspiration and injection.  Today it is more erythematous than last time, I have a very small suspicion for septic bursitis, so I did not inject with steroids. 1 g of Rocephin intramuscular, 30 mg Toradol intramuscular. Aspirated fluid, sent off for culture and cell counts as well as crystal analysis. Doxycycline twice a day. Prednisone, which he will not take until I call him and let him know that the cultures are negative. Percocet, meloxicam. Return to see me in a week.

## 2012-09-25 NOTE — Progress Notes (Addendum)
  Subjective:    Consultation requested by Dr. Alvester Morin.  CC: Left knee pain  HPI: I was called to the urgent care center to evaluate this pleasant 47 year old male  with a one-day history of increasing pain, redness, swelling on the anterior aspect of his left knee. He has a history of gout, and has a history of prepatellar bursitis, in fact I aspirated and injected it approximately 3 months ago. At that time, the aspirate was culture negative but also crystal negative. Pain is severe, localized, doesn't radiate, no fevers, no chills.  Past medical history, Surgical history, Family history not pertinant except as noted below, Social history, Allergies, and medications have been entered into the medical record, reviewed, and no changes needed.   Review of Systems: No fevers, chills, night sweats, weight loss, chest pain, or shortness of breath.   Objective:    General: Well Developed, well nourished, and in no acute distress.  Neuro: Alert and oriented x3, extra-ocular muscles intact, sensation grossly intact.  HEENT: Normocephalic, atraumatic, pupils equal round reactive to light, neck supple, no masses, no lymphadenopathy, thyroid nonpalpable.  Skin: Warm and dry, no rashes. Cardiac: Regular rate and rhythm, no murmurs rubs or gallops, no lower extremity edema.  Respiratory: Clear to auscultation bilaterally. Not using accessory muscles, speaking in full sentences. Left Knee: There is a discrete area is erythema and fullness over the prepatellar bursa.  There is no effusion, and the knee has good range of motion, painless. ROM full in flexion and extension and lower leg rotation. Ligaments with solid consistent endpoints including ACL, PCL, LCL, MCL. Negative Mcmurray's, Apley's, and Thessalonian tests. Non painful patellar compression. Patellar glide without crepitus. Patellar and quadriceps tendons unremarkable. Hamstring and quadriceps strength is normal.   Procedure: Real-time  Ultrasound Guided aspiration of prepatellar bursa Device: GE Logiq E  Verbal informed consent obtained.  Time-out conducted.  Noted no overlying erythema, induration, or other signs of local infection.  Skin prepped in a sterile fashion.  Local anesthesia: Topical Ethyl chloride.  With sterile technique and under real time ultrasound guidance:  Fluid-filled prepatellar bursa seen clearly on ultrasound, local anesthesia with 7 cc of 1% lidocaine with epinephrine, then 18-gauge needle inserted under guidance into the prepatellar bursa, approximately 7 cc of fluid aspirated. This fluid was serosanguineous, but not cloudy. Fluid was sent off for culture, cell count, Gram stain, crystal analysis. Completed without difficulty  Pain immediately improved. Advised to call if fevers/chills, erythema, induration, drainage, or persistent bleeding.  Images permanently stored and available for review in the ultrasound unit.  Impression: Technically successful ultrasound guided aspiration. Impression and Recommendations:

## 2012-09-25 NOTE — ED Notes (Signed)
Patient c/o left knee pain and swelling since morning. Patient has not tried anything at home.

## 2012-09-25 NOTE — ED Provider Notes (Addendum)
History     CSN: [id number]  Arrival date & time 09/25/12  1711   First MD Initiated Contact with Patient 09/25/12 1714      Chief Complaint  Patient presents with  . Knee Pain   HPI  Pt presents today with chief complaint of knee pain  Has been present x 1 day Woke up this am with severe L knee pain  Knee has been swollen and tender Had similar episode 3 months ago. Was diagnosed with a virtual bursitis. There was some concern for gout. Uric acid normal. Did respond well to intra-articular steroids. Pain is worse than before. Redness and warmth is also new. Nondiabetic.  Past Medical History  Diagnosis Date  . 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 Neck surgery, cervical disckectomy  11-03  . Cholecystectomy  11-95  . Appendectomy      Family History  Problem Relation Age of Onset  . Cancer Mother   . Cancer Other     brain  . Leukemia Other   . Hypertension Other     History  Substance Use Topics  . Smoking status: Never Smoker   . Smokeless tobacco: Not on file  . Alcohol Use: Yes      Review of Systems  All other systems reviewed and are negative.    Allergies  Review of patient's allergies indicates no known allergies.  Home Medications   Current Outpatient Rx  Name  Route  Sig  Dispense  Refill  . lisinopril (PRINIVIL,ZESTRIL) 40 MG tablet   Oral   Take 1 tablet (40 mg total) by mouth daily.   30 tablet   6   . EXPIRED: metoprolol (LOPRESSOR) 50 MG tablet   Oral   Take 1 tablet (50 mg total) by mouth 2 (two) times daily.   60 tablet   6   . promethazine (PHENERGAN) 25 MG tablet   Oral   Take 1 tablet (25 mg total) by mouth every 6 (six) hours as needed for nausea.   30 tablet   0     BP 130/87  Pulse 102  Temp(Src) 99 F (37.2 C) (Oral)  Resp 20  Ht 5\' 10"  (1.778 m)  Wt 235 lb 4 oz (106.709 kg)  BMI 33.75 kg/m2  SpO2 99%  Physical Exam  Constitutional: He appears  well-developed and well-nourished.  HENT:  Head: Normocephalic and atraumatic.  Eyes: Conjunctivae are normal. Pupils are equal, round, and reactive to light.  Neck: Normal range of motion.  Cardiovascular: Normal rate and regular rhythm.   Pulmonary/Chest: Effort normal and breath sounds normal.  Abdominal: Soft.  Musculoskeletal:       Legs:   ED Course  Procedures (including critical care time)  Labs Reviewed  POCT CBC W AUTO DIFF ([facility])   No results found.   1. Knee pain, left       MDM  Differential diagnosis for knee pain is fairly broad including septic arthritis, gout, inflammatory arthritis. No history of prepatellar bursitis in the past. Noted white blood cell count 12.3 with a mild left shift. Afebrile today. Given the effusion, patient would benefit from drainage of fluid for further analysis. We'll consult sports medicine as patient has been seen by them in the past. Treatment plan followup per sports medicine recommendations.    The patient and/or caregiver has been counseled thoroughly with regard to treatment plan and/or medications  prescribed including dosage, schedule, interactions, rationale for use, and possible side effects and they verbalize understanding. Diagnoses and expected course of recovery discussed and will return if not improved as expected or if the condition worsens. Patient and/or caregiver verbalized understanding.              Doree Albee, MD 09/25/12 1809  Doree Albee, MD 09/25/12 [phone]

## 2012-09-26 ENCOUNTER — Telehealth: Payer: Self-pay | Admitting: Physician Assistant

## 2012-09-26 LAB — SYNOVIAL CELL COUNT + DIFF, W/ CRYSTALS
Crystals, Fluid: NONE SEEN
Eosinophils-Synovial: 2 % — ABNORMAL HIGH (ref 0–1)
Lymphocytes-Synovial Fld: 11 % (ref 0–20)
Monocyte/Macrophage: 12 % — ABNORMAL LOW (ref 50–90)
Neutrophil, Synovial: 75 % — ABNORMAL HIGH (ref 0–25)
WBC, Synovial: 840 uL — ABNORMAL HIGH (ref 0–200)

## 2012-09-26 NOTE — Telephone Encounter (Signed)
Called lab, they will run all tests including gram stain, cultures, cell counts, crystal analysis.

## 2012-09-26 NOTE — Telephone Encounter (Addendum)
Thanks Lesly Rubenstein I will take care of this.

## 2012-09-26 NOTE — Telephone Encounter (Signed)
I was called by lab this weekend and stated that joint fluid was not collected in correct tube for CNF with crystals on Friday evening. Will need to redraw on Monday to have ran. They were not able to run test.

## 2012-09-27 ENCOUNTER — Telehealth: Payer: Self-pay | Admitting: Emergency Medicine

## 2012-09-29 LAB — BODY FLUID CULTURE
Gram Stain: NONE SEEN
Organism ID, Bacteria: NO GROWTH

## 2012-10-16 ENCOUNTER — Other Ambulatory Visit: Payer: Self-pay | Admitting: Family Medicine

## 2012-12-16 ENCOUNTER — Ambulatory Visit (INDEPENDENT_AMBULATORY_CARE_PROVIDER_SITE_OTHER): Payer: BC Managed Care – PPO | Admitting: Family Medicine

## 2012-12-16 ENCOUNTER — Encounter: Payer: Self-pay | Admitting: Family Medicine

## 2012-12-16 VITALS — BP 119/73 | HR 82 | Wt 237.0 lb

## 2012-12-16 DIAGNOSIS — G4700 Insomnia, unspecified: Secondary | ICD-10-CM

## 2012-12-16 DIAGNOSIS — F32A Depression, unspecified: Secondary | ICD-10-CM

## 2012-12-16 DIAGNOSIS — F341 Dysthymic disorder: Secondary | ICD-10-CM

## 2012-12-16 DIAGNOSIS — F329 Major depressive disorder, single episode, unspecified: Secondary | ICD-10-CM

## 2012-12-16 MED ORDER — SERTRALINE HCL 50 MG PO TABS: ORAL_TABLET | ORAL | Status: DC

## 2012-12-16 MED ORDER — ZOLPIDEM TARTRATE 10 MG PO TABS: 10.0000 mg | ORAL_TABLET | Freq: Every evening | ORAL | Status: DC | PRN

## 2012-12-16 NOTE — Progress Notes (Signed)
Subjective:    Patient ID: Christopher Lloyd, male    DOB: Jan 26, 1966, 47 y.o.   MRN: [id number]  HPI Here to discuss anxiety. He is complaining of feeling nervous occasionally as the day. Becoming easily annoyed and irritable daily as well. History relaxing and feels like he's not able to control his worrying. He also reports decreased pleasure and interest in doing things as well as sleep issues and feeling bad about himself.Not sleeping well at all.  Started about 2 weeks ago.  No known triggers. No stressors currently. He denies any suicidal thoughts. He has had a lot of anger feelings at work.   Review of Systems  BP 119/73  Pulse 82  Wt 237 lb (107.502 kg)  BMI 34.01 kg/m2    Allergies  Allergen Reactions  . Paxil [Paroxetine Hcl] Other (See Comments)    Worsening mood    Past Medical History  Diagnosis Date  . 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 Neck surgery, cervical disckectomy  11-03  . Cholecystectomy  11-95  . Appendectomy      History   Social History  . Marital Status: Divorced    Spouse Name: N/A    Number of Children: N/A  . Years of Education: N/A   Occupational History  . Not on file.   Social History Main Topics  . Smoking status: Never Smoker   . Smokeless tobacco: Not on file  . Alcohol Use: Yes  . Drug Use: No  . Sexual Activity: Not on file   Other Topics Concern  . Not on file   Social History Narrative  . No narrative on file    Family History  Problem Relation Age of Onset  . Cancer Mother   . Cancer Other     brain  . Leukemia Other   . Hypertension Other     Outpatient Encounter Prescriptions as of 12/16/2012  Medication Sig Dispense Refill  . lisinopril (PRINIVIL,ZESTRIL) 40 MG tablet TAKE 1 TABLET (40 MG TOTAL) BY MOUTH DAILY.  30 tablet  6  . metoprolol (LOPRESSOR) 50 MG tablet TAKE 1 TABLET (50 MG TOTAL) BY MOUTH 2 (TWO) TIMES DAILY.  60 tablet  6  . sertraline  (ZOLOFT) 50 MG tablet 1/2 tab daily x 1 week, then increase to whole tab daily  30 tablet  0  . zolpidem (AMBIEN) 10 MG tablet Take 1 tablet (10 mg total) by mouth at bedtime as needed for sleep.  30 tablet  0  . [DISCONTINUED] meloxicam (MOBIC) 15 MG tablet One tab PO qAM with breakfast for 2 weeks, then daily prn pain.  30 tablet  3  . [DISCONTINUED] oxyCODONE-acetaminophen (PERCOCET/ROXICET) 5-325 MG per tablet Take 1 tablet by mouth every 8 (eight) hours as needed for pain.  30 tablet  0  . [DISCONTINUED] predniSONE (DELTASONE) 10 MG tablet 4 tabs po daily x 6 days, then 3 tabs po daily x 4 days, then 2 tabs po daily x 4 days, then 1 tab po daily x 4 days  48 tablet  0  . [DISCONTINUED] promethazine (PHENERGAN) 25 MG tablet Take 1 tablet (25 mg total) by mouth every 6 (six) hours as needed for nausea.  30 tablet  0   No facility-administered encounter medications on file as of 12/16/2012.          Objective:   Physical Exam  Constitutional: He is oriented to person, place,  and time. He appears well-developed and well-nourished.  HENT:  Head: Normocephalic and atraumatic.  Cardiovascular: Normal rate, regular rhythm and normal heart sounds.   Pulmonary/Chest: Effort normal and breath sounds normal.  Neurological: He is alert and oriented to person, place, and time.  Skin: Skin is warm and dry.  Psychiatric: He has a normal mood and affect. His behavior is normal.          Assessment & Plan:  Anxiety/Depression - symptoms are currently uncontrolled. We discussed treatment with counseling or therapy, and exercise, and medication. He is not interested in therapy or counseling at this time I encouraged him to strongly think about it especially since he is having a lot of anger issues. We will start him on sertraline 50 mg. Take half a tab daily for one week and then increase to whole tab. Warned about potential side effects with the medication. Followup in 3-4 weeks to make sure that  she's tolerating it well and not having any side effects or concerns about the medication. gad-7=12 phq-9=11. He does have a vacation coming up in about 2 weeks. [place] and I think is what should be a good time for him to decompress. We also discussed that it may be time for him to think about getting a new job. He's been in his current position for 9 years and absolutely hates his job.  Insomnia - I discussed with him  that I really think insomnia is related to his anxiety and depression. He has taken Ambien in the past and did well with it. He gave him a single prescription to use as needed. Warned about dependency.  Time spent 25 min, >50% spent counseling about anxiety, depression, insomnia

## 2013-01-12 ENCOUNTER — Ambulatory Visit (INDEPENDENT_AMBULATORY_CARE_PROVIDER_SITE_OTHER): Payer: BC Managed Care – PPO | Admitting: Family Medicine

## 2013-01-12 ENCOUNTER — Encounter: Payer: Self-pay | Admitting: Family Medicine

## 2013-01-12 VITALS — BP 101/58 | HR 88 | Wt 231.0 lb

## 2013-01-12 DIAGNOSIS — F341 Dysthymic disorder: Secondary | ICD-10-CM

## 2013-01-12 DIAGNOSIS — F32A Depression, unspecified: Secondary | ICD-10-CM

## 2013-01-12 DIAGNOSIS — F329 Major depressive disorder, single episode, unspecified: Secondary | ICD-10-CM

## 2013-01-12 DIAGNOSIS — G4700 Insomnia, unspecified: Secondary | ICD-10-CM

## 2013-01-12 MED ORDER — ESZOPICLONE 3 MG PO TABS: 3.0000 mg | ORAL_TABLET | Freq: Every day | ORAL | Status: DC

## 2013-01-12 MED ORDER — SERTRALINE HCL 100 MG PO TABS: 100.0000 mg | ORAL_TABLET | Freq: Every day | ORAL | Status: DC

## 2013-01-12 NOTE — Progress Notes (Signed)
  Subjective:    Patient ID: Christopher Lloyd, male    DOB: 05/08/1965, 47 y.o.   MRN: [id number]  HPI  F/U anxiety/depression - Started him on sertraline 50mg  about 3 weeks ago. Works sometime but not consistant.  He really hates his job.  No side effects.  He has not smoked counseling and says he is open to it. Still having poor sleep quality. Still feeling on edge and angry at times. He did just get back from vacation and that was helpful.  Insomnia - Says the trazodone and the Ambien don't work. Has hard time shutting mind down to sleep and then only sleeps for about 3 hours before he wakes up.    Review of Systems     Objective:   Physical Exam  Constitutional: He is oriented to person, place, and time. He appears well-developed and well-nourished.  HENT:  Head: Normocephalic and atraumatic.  Neurological: He is alert and oriented to person, place, and time.  Skin: Skin is warm and dry.  Psychiatric: He has a normal mood and affect. His behavior is normal.          Assessment & Plan:  Anxiety/Depression - uncontrolled. I'm happy to refer him for counseling I think this would be fantastic. discussed options. Will inc sertraline to 100mg  and f/U in 1 mo. PHQ-9 score 11 and GAD-7 score 10. Will refer for counseling.   Insomnia - Will try lunesta. iF fails that then consider seroquel.   Declined flu vaccine.

## 2013-02-12 ENCOUNTER — Encounter (HOSPITAL_COMMUNITY): Payer: Self-pay | Admitting: Behavioral Health

## 2013-02-12 ENCOUNTER — Encounter (INDEPENDENT_AMBULATORY_CARE_PROVIDER_SITE_OTHER): Payer: Self-pay

## 2013-02-12 ENCOUNTER — Ambulatory Visit (INDEPENDENT_AMBULATORY_CARE_PROVIDER_SITE_OTHER): Payer: BC Managed Care – PPO | Admitting: Behavioral Health

## 2013-02-12 DIAGNOSIS — F332 Major depressive disorder, recurrent severe without psychotic features: Secondary | ICD-10-CM

## 2013-02-12 DIAGNOSIS — F331 Major depressive disorder, recurrent, moderate: Secondary | ICD-10-CM

## 2013-02-12 DIAGNOSIS — F411 Generalized anxiety disorder: Secondary | ICD-10-CM
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
## 2013-02-15 ENCOUNTER — Encounter: Payer: Self-pay | Admitting: Family Medicine

## 2013-02-15 ENCOUNTER — Ambulatory Visit (INDEPENDENT_AMBULATORY_CARE_PROVIDER_SITE_OTHER): Payer: BC Managed Care – PPO | Admitting: Family Medicine

## 2013-02-15 VITALS — BP 128/76 | HR 81 | Temp 98.2°F | Wt 225.0 lb

## 2013-02-15 DIAGNOSIS — F329 Major depressive disorder, single episode, unspecified: Secondary | ICD-10-CM

## 2013-02-15 DIAGNOSIS — F32A Depression, unspecified: Secondary | ICD-10-CM

## 2013-02-15 DIAGNOSIS — F341 Dysthymic disorder: Secondary | ICD-10-CM

## 2013-02-15 DIAGNOSIS — G4700 Insomnia, unspecified: Secondary | ICD-10-CM

## 2013-02-15 MED ORDER — QUETIAPINE FUMARATE 100 MG PO TABS: 50.0000 mg | ORAL_TABLET | Freq: Every day | ORAL | Status: DC

## 2013-02-15 NOTE — Progress Notes (Signed)
  Subjective:    Patient ID: Christopher Lloyd, male    DOB: June 07, 1965, 47 y.o.   MRN: [id number]  HPI Here to followup on May today. He has been taking the sertraline 1 mg regularly. He also try the Lunesta 3 mg for sleep and says it really did not help him. He is Re: tried Ambien and trazodone in the past without significant improvement. He still complains of feeling like he is really worries about things almost every day. Has trouble relaxing and becomes easily annoyed and irritable. He also is still having significant sleep dysfunction. Poor appetite. He says in fact she usually only eats once a day because he just has no appetite. He still feels down and has little pleasure in doing things more than half of the days. He denies thoughts of being better off dead or hurting himself.    Review of Systems     Objective:   Physical Exam  Constitutional: He is oriented to person, place, and time. He appears well-developed and well-nourished.  HENT:  Head: Normocephalic and atraumatic.  Neurological: He is alert and oriented to person, place, and time.  Skin: Skin is warm and dry.  Psychiatric: He has a normal mood and affect. His behavior is normal.          Assessment & Plan:  Depression/anxiety-uncontrolled. In fact his previous gad 7 score was 11 and it is 11 today. His previous PHQ 9 score was 10 and his is actually gone up and his 14 today. We discussed different options including changing the sertraline doesn't recollect is not completely therapeutic on it. He does strongly feel that the Zoloft is helpful for him. We discussed keeping the Zoloft for now and adding Seroquel at bedtime for depression as well as for sleep. I really think that his sleep disorder is significantly related to his underlying mood disorder. I'm hoping that this will help. We'll start with 50 mg at bedtime. Encouraged him to take about an hour before take bedtime and move ahead if needed. And tolerating well but  feels like the 50 mg is not adequate then he can go ahead and go up to whole tab, 100 mg and time I see him back. We can adjust the dose that time. Also consider changing to extend release product if needed. Prescription sent to pharmacy.  Insomnia-has failed trazodone, Ambien, Lunesta. Since I think his sleep disorder is directly related to mood we'll try Seroquel. Warned about potential side effects. Take at least an hour before bedtime. Adjust dose as needed follow up in one month.

## 2013-02-26 ENCOUNTER — Ambulatory Visit (HOSPITAL_COMMUNITY): Payer: Self-pay | Admitting: Behavioral Health

## 2013-03-01 ENCOUNTER — Ambulatory Visit (HOSPITAL_COMMUNITY): Payer: Self-pay | Admitting: Behavioral Health

## 2013-03-17 ENCOUNTER — Encounter: Payer: Self-pay | Admitting: Family Medicine

## 2013-03-17 ENCOUNTER — Ambulatory Visit (INDEPENDENT_AMBULATORY_CARE_PROVIDER_SITE_OTHER): Payer: BC Managed Care – PPO | Admitting: Family Medicine

## 2013-03-17 VITALS — BP 112/78 | HR 93 | Resp 16 | Wt 229.0 lb

## 2013-03-17 DIAGNOSIS — F418 Other specified anxiety disorders: Secondary | ICD-10-CM

## 2013-03-17 DIAGNOSIS — F341 Dysthymic disorder: Secondary | ICD-10-CM

## 2013-03-17 MED ORDER — QUETIAPINE FUMARATE 200 MG PO TABS: ORAL_TABLET | ORAL | Status: DC

## 2013-03-17 MED ORDER — SERTRALINE HCL 100 MG PO TABS: 100.0000 mg | ORAL_TABLET | Freq: Every day | ORAL | Status: DC

## 2013-03-17 NOTE — Progress Notes (Signed)
   Subjective:    Patient ID: Christopher Lloyd, male    DOB: 22-Feb-1966, 47 y.o.   MRN: [id number]  HPI Depression/Anxiety  - Says much less irritable at work. He is also sleeping better. He tried multiple sleep agents in the past and says it still not perfect but it's much better and he is very happy with it. He denies any side effects from the Seroquel. He is still taking the sertraline 100 mg and needs a refill. His last pill yesterday. Is feeling much less anxious and irritable. He has someone at work that he does not get along well within says he's been able to manage that a lot better. He felt much less down and depressed.   Review of Systems     Objective:   Physical Exam  Constitutional: He is oriented to person, place, and time. He appears well-developed and well-nourished.  Neurological: He is alert and oriented to person, place, and time.  Skin: Skin is warm and dry.  Psychiatric: He has a normal mood and affect. His behavior is normal.          Assessment & Plan:  Depression/anxiety-significantly improved by the addition of Seroquel to his Zoloft. He's currently taking 100 mg. GAD-7 score of 4 and PHQ-9 score of 5. We'll increase Seroquel to 200 mg at bedtime and half a tab in the morning. We'll continue sertraline at current dose. Refill sent to pharmacy. Call in 6-8 weeks to make sure that he still doing well and adjust dose as needed. If he experiences too much sedation the Seroquel then he can back down to 100 mg at twice a day. I'm very happy with his progress.

## 2013-05-13 ENCOUNTER — Other Ambulatory Visit: Payer: Self-pay | Admitting: Family Medicine

## 2013-06-14 ENCOUNTER — Ambulatory Visit (INDEPENDENT_AMBULATORY_CARE_PROVIDER_SITE_OTHER): Payer: BC Managed Care – PPO | Admitting: Sports Medicine

## 2013-06-14 ENCOUNTER — Encounter: Payer: Self-pay | Admitting: Sports Medicine

## 2013-06-14 VITALS — BP 131/77 | HR 100 | Ht 70.0 in | Wt 232.0 lb

## 2013-06-14 DIAGNOSIS — G8929 Other chronic pain: Secondary | ICD-10-CM

## 2013-06-14 DIAGNOSIS — M545 Low back pain, unspecified: Secondary | ICD-10-CM

## 2013-06-14 MED ORDER — PREDNISONE 50 MG PO TABS: ORAL_TABLET | ORAL | Status: DC

## 2013-06-14 NOTE — Progress Notes (Signed)
  Subjective:    CC: Low back pain  HPI: Christopher Lloyd returns to see me, he has moderate to severe low back pain with left-sided radicular symptoms. He's never had interventional treatment, is moderate, persistent, worse with sitting, and flexion.  Past medical history, Surgical history, Family history not pertinant except as noted below, Social history, Allergies, and medications have been entered into the medical record, reviewed, and no changes needed.   Review of Systems: No fevers, chills, night sweats, weight loss, chest pain, or shortness of breath.   Objective:    General: Well Developed, well nourished, and in no acute distress.  Neuro: Alert and oriented x3, extra-ocular muscles intact, sensation grossly intact.  HEENT: Normocephalic, atraumatic, pupils equal round reactive to light, neck supple, no masses, no lymphadenopathy, thyroid nonpalpable.  Skin: Warm and dry, no rashes. Cardiac: Regular rate and rhythm, no murmurs rubs or gallops, no lower extremity edema.  Respiratory: Clear to auscultation bilaterally. Not using accessory muscles, speaking in full sentences.  Impression and Recommendations:

## 2013-06-14 NOTE — Assessment & Plan Note (Signed)
Christopher Lloyd does have some left sided lumbar radicular symptoms. At this point we are going to obtain an MRI, prednisone, Celebrex samples given. Return to see me for MRI results and for interventional injection planning.

## 2013-06-15 ENCOUNTER — Telehealth: Payer: Self-pay | Admitting: *Deleted

## 2013-06-15 NOTE — Telephone Encounter (Signed)
MRI Lumbar spine w/o contrast approved through [employer].  Auth # [id number].  Imaging notified. Barry DienesKimberly Makeisha Jentsch, LPN

## 2013-06-29 ENCOUNTER — Ambulatory Visit (INDEPENDENT_AMBULATORY_CARE_PROVIDER_SITE_OTHER): Payer: BC Managed Care – PPO

## 2013-06-29 DIAGNOSIS — M545 Low back pain, unspecified: Secondary | ICD-10-CM

## 2013-06-29 DIAGNOSIS — M479 Spondylosis, unspecified: Secondary | ICD-10-CM

## 2013-06-29 DIAGNOSIS — G8929 Other chronic pain: Secondary | ICD-10-CM

## 2013-07-07 ENCOUNTER — Encounter: Payer: Self-pay | Admitting: Sports Medicine

## 2013-07-07 ENCOUNTER — Ambulatory Visit (INDEPENDENT_AMBULATORY_CARE_PROVIDER_SITE_OTHER): Payer: BC Managed Care – PPO | Admitting: Sports Medicine

## 2013-07-07 VITALS — BP 134/91 | HR 95 | Ht 70.0 in | Wt 235.0 lb

## 2013-07-07 DIAGNOSIS — G8929 Other chronic pain: Secondary | ICD-10-CM

## 2013-07-07 DIAGNOSIS — M545 Low back pain, unspecified: Secondary | ICD-10-CM

## 2013-07-07 NOTE — Assessment & Plan Note (Signed)
Christopher Lloyd has very severe lumbar spondylosis with multilevel degenerative disc disease, at every level, he also has facet spondylosis at nearly every level. At this point we are going to proceed with interventional treatment, first in the form of a left-sided L5-S1 and L2-L3 epidural. If he doesn't get sufficient response we will hit bilateral facet joints at his lower 3 levels.  I like to see him back one to 2 weeks after his epidurals to evaluate response.  We did discuss the unlikely possibility of surgical intervention helping, and the likely need for pain management.

## 2013-07-07 NOTE — Progress Notes (Signed)
  Subjective:    CC: Recheck MRI results  HPI: This is a pleasant 48 year old male, he's had chronic back pain for years now. We placed him to conservative measures including steroids, NSAIDs, muscle relaxers, rehabilitation, unfortunately continued to have pain, it occasionally radiates down the left leg in an L5 versus an S1 distribution, but he also has significant axial pain that is relieved with forward flexion touching his toes. It is not worse with sitting in a car for long periods of time, or Valsalva. Pain is severe, persistent.  Past medical history, Surgical history, Family history not pertinant except as noted below, Social history, Allergies, and medications have been entered into the medical record, reviewed, and no changes needed.   Review of Systems: No fevers, chills, night sweats, weight loss, chest pain, or shortness of breath.   Objective:    General: Well Developed, well nourished, and in no acute distress.  Neuro: Alert and oriented x3, extra-ocular muscles intact, sensation grossly intact.  HEENT: Normocephalic, atraumatic, pupils equal round reactive to light, neck supple, no masses, no lymphadenopathy, thyroid nonpalpable.  Skin: Warm and dry, no rashes. Cardiac: Regular rate and rhythm, no murmurs rubs or gallops, no lower extremity edema.  Respiratory: Clear to auscultation bilaterally. Not using accessory muscles, speaking in full sentences. Back Exam:  Inspection: Unremarkable  Motion: Flexion 45 deg, Extension 45 deg, Side Bending to 45 deg bilaterally,  Rotation to 45 deg bilaterally  SLR laying: Negative  XSLR laying: Negative  Palpable tenderness: Midline lumbar spine from L2 down to S1.Marland Kitchen. FABER: negative. Sensory change: Gross sensation intact to all lumbar and sacral dermatomes.  Reflexes: 2+ at both patellar tendons, 2+ at achilles tendons, Babinski's downgoing.  Strength at foot  Plantar-flexion: 5/5 Dorsi-flexion: 5/5 Eversion: 5/5 Inversion: 5/5    Leg strength  Quad: 5/5 Hamstring: 5/5 Hip flexor: 5/5 Hip abductors: 5/5  Gait unremarkable.  MRI was reviewed and shows fairly severe degenerative disc disease with severe spinal stenosis at multiple levels, the worst at the L2-L3 as well as L5-S1 levels, there is also bilateral foraminal stenosis at nearly every level as well as bilateral facet arthrosis at nearly every level.  Impression and Recommendations:

## 2013-07-14 ENCOUNTER — Ambulatory Visit
Admission: RE | Admit: 2013-07-14 | Discharge: 2013-07-14 | Disposition: A | Discharge status: Home / Self Care | Payer: BC Managed Care – PPO | Source: Ambulatory Visit | Attending: Sports Medicine | Admitting: Sports Medicine

## 2013-07-14 ENCOUNTER — Other Ambulatory Visit: Payer: Self-pay | Admitting: Sports Medicine

## 2013-07-14 DIAGNOSIS — G8929 Other chronic pain: Secondary | ICD-10-CM

## 2013-07-14 DIAGNOSIS — M545 Low back pain: Principal | ICD-10-CM

## 2013-07-14 MED ORDER — IOHEXOL 180 MG/ML  SOLN
1.0000 mL | Freq: Once | INTRAMUSCULAR | Status: AC | PRN
  Administered 2013-07-14: 1 mL via EPIDURAL

## 2013-07-14 MED ORDER — METHYLPREDNISOLONE ACETATE 40 MG/ML INJ SUSP (RADIOLOG
120.0000 mg | Freq: Once | INTRAMUSCULAR | Status: AC
  Administered 2013-07-14: 120 mg via EPIDURAL

## 2013-07-14 NOTE — Discharge Instructions (Signed)

## 2013-07-19 ENCOUNTER — Other Ambulatory Visit: Payer: Self-pay | Admitting: Family Medicine

## 2013-07-26 ENCOUNTER — Ambulatory Visit (INDEPENDENT_AMBULATORY_CARE_PROVIDER_SITE_OTHER): Payer: BC Managed Care – PPO | Admitting: Family Medicine

## 2013-07-26 ENCOUNTER — Encounter: Payer: Self-pay | Admitting: Family Medicine

## 2013-07-26 VITALS — BP 122/80 | HR 80 | Wt 230.0 lb

## 2013-07-26 DIAGNOSIS — F341 Dysthymic disorder: Secondary | ICD-10-CM

## 2013-07-26 DIAGNOSIS — F418 Other specified anxiety disorders: Secondary | ICD-10-CM | POA: Insufficient documentation

## 2013-07-26 DIAGNOSIS — I10 Essential (primary) hypertension: Secondary | ICD-10-CM

## 2013-07-26 MED ORDER — QUETIAPINE FUMARATE 200 MG PO TABS: ORAL_TABLET | ORAL | Status: DC

## 2013-07-26 MED ORDER — QUETIAPINE FUMARATE 200 MG PO TABS: 200.0000 mg | ORAL_TABLET | Freq: Two times a day (BID) | ORAL | Status: DC

## 2013-07-26 MED ORDER — SERTRALINE HCL 100 MG PO TABS: 100.0000 mg | ORAL_TABLET | Freq: Every day | ORAL | Status: DC
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
## 2013-08-03 ENCOUNTER — Ambulatory Visit: Payer: BC Managed Care – PPO | Admitting: Sports Medicine

## 2013-08-05 ENCOUNTER — Ambulatory Visit: Payer: BC Managed Care – PPO | Admitting: Sports Medicine

## 2013-08-09 ENCOUNTER — Encounter: Payer: Self-pay | Admitting: Sports Medicine

## 2013-08-09 ENCOUNTER — Ambulatory Visit (INDEPENDENT_AMBULATORY_CARE_PROVIDER_SITE_OTHER): Payer: BC Managed Care – PPO | Admitting: Sports Medicine

## 2013-08-09 VITALS — BP 122/71 | HR 85 | Ht 70.0 in | Wt 231.0 lb

## 2013-08-09 DIAGNOSIS — M545 Low back pain, unspecified: Secondary | ICD-10-CM

## 2013-08-09 DIAGNOSIS — G8929 Other chronic pain: Secondary | ICD-10-CM

## 2013-08-09 MED ORDER — OXYCODONE-ACETAMINOPHEN 5-325 MG PO TABS: 1.0000 | ORAL_TABLET | Freq: Three times a day (TID) | ORAL | Status: DC | PRN

## 2013-08-09 NOTE — Progress Notes (Signed)
  Subjective:    CC: Followup after epidural  HPI: Christopher Lloyd returns, he is post L2-3 and L5-S1 epidural, his axial pain remains but is radicular symptoms have resolved, moderate, persistent.  Past medical history, Surgical history, Family history not pertinant except as noted below, Social history, Allergies, and medications have been entered into the medical record, reviewed, and no changes needed.   Review of Systems: No fevers, chills, night sweats, weight loss, chest pain, or shortness of breath.   Objective:    General: Well Developed, well nourished, and in no acute distress.  Neuro: Alert and oriented x3, extra-ocular muscles intact, sensation grossly intact.  HEENT: Normocephalic, atraumatic, pupils equal round reactive to light, neck supple, no masses, no lymphadenopathy, thyroid nonpalpable.  Skin: Warm and dry, no rashes. Cardiac: Regular rate and rhythm, no murmurs rubs or gallops, no lower extremity edema.  Respiratory: Clear to auscultation bilaterally. Not using accessory muscles, speaking in full sentences.  Impression and Recommendations:

## 2013-08-09 NOTE — Assessment & Plan Note (Addendum)
Christopher Lloyd returns after his L5-S1 and L2-L3 epidural, his leg and radicular symptoms have essentially resolved. As expected he continues to have axial to pain, better with flexion. This further lends to the suspicion that his axial pain is facet mediated. At this point we are going to proceed with a bilateral L3-L4, L4-L5, and L5-S1 facet joint injections. Should he have a good response we will proceed down the pathway of radio frequency ablation. Return to see me in 2 weeks.

## 2013-08-16 ENCOUNTER — Ambulatory Visit
Admission: RE | Admit: 2013-08-16 | Discharge: 2013-08-16 | Disposition: A | Discharge status: Home / Self Care | Payer: BC Managed Care – PPO | Source: Ambulatory Visit | Attending: Sports Medicine | Admitting: Sports Medicine

## 2013-08-16 ENCOUNTER — Other Ambulatory Visit: Payer: Self-pay | Admitting: Sports Medicine

## 2013-08-16 DIAGNOSIS — M545 Low back pain, unspecified: Secondary | ICD-10-CM

## 2013-08-16 DIAGNOSIS — G8929 Other chronic pain: Secondary | ICD-10-CM

## 2013-08-16 IMAGING — RF DG FACET JT INJ L OR S SPINE SINGLE LEVEL *L*
1 series · 6 of 6 positions shown · non-contrast
Comparison: none

CLINICAL DATA: Lumbago. The patient continues to have axial back
pain. Following a previous epidural steroid injection his lower
extremity radiculitis has resolved.

[Series 1: dg facet jt inj l /s single level*l*w/fl · 6 of 6 slices shown]
[im 1/6]
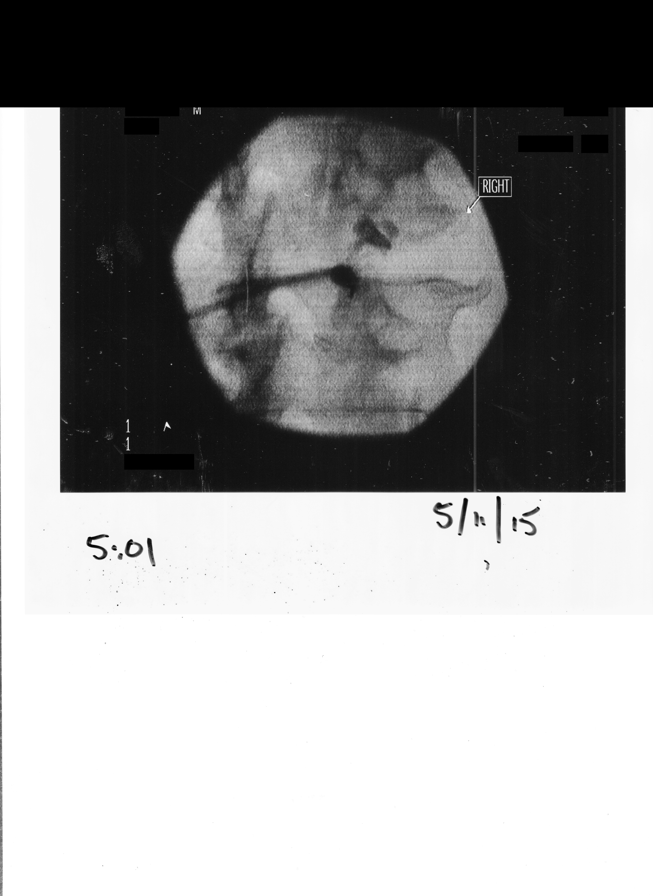
[im 2/6]
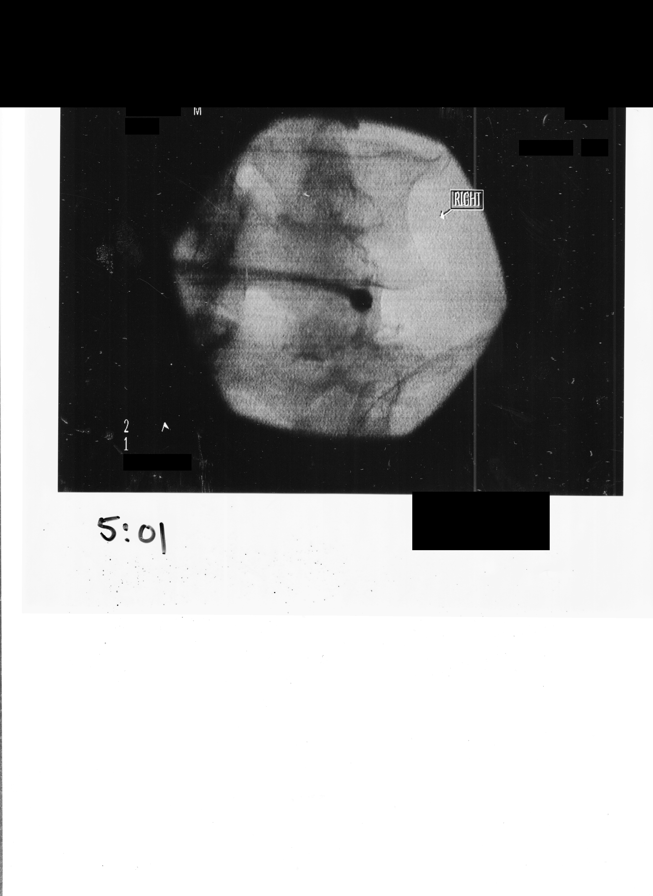
[im 3/6]
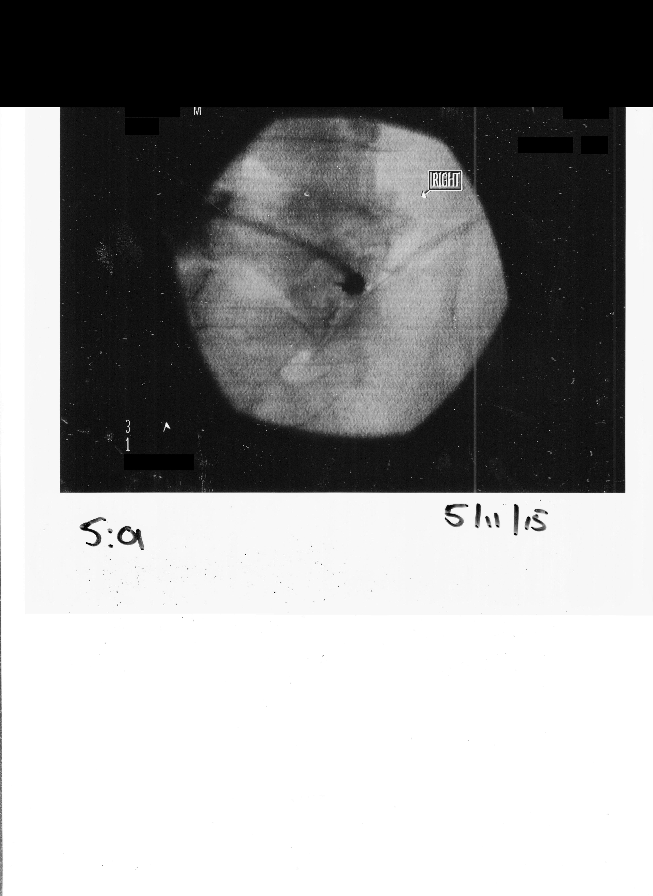
[im 4/6]
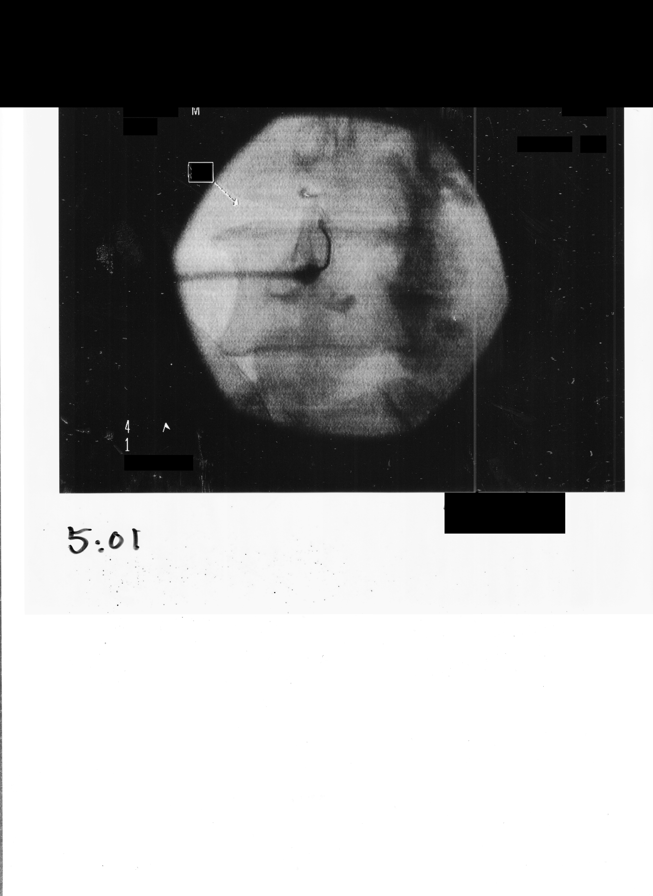
[im 5/6]
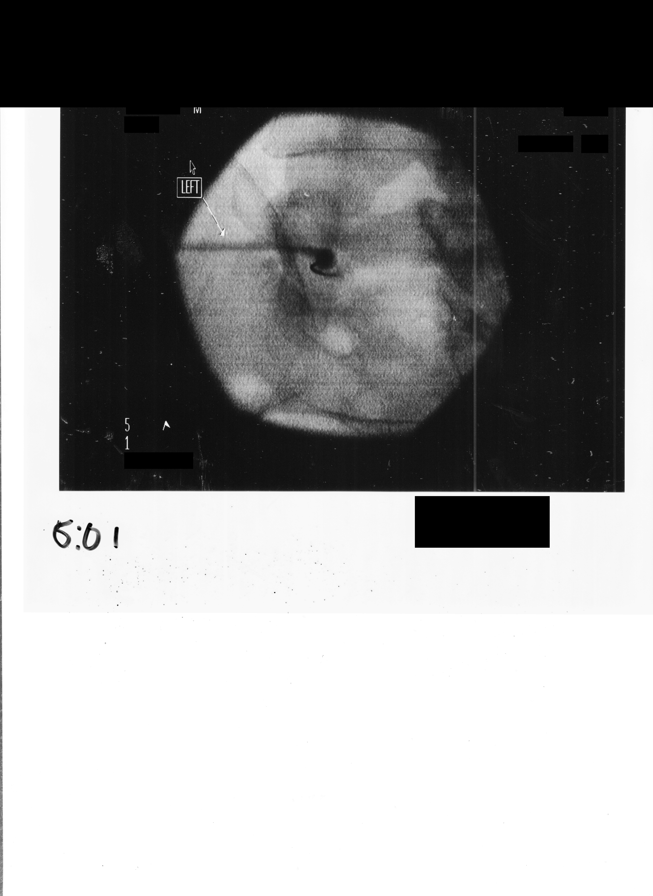
[im 6/6]
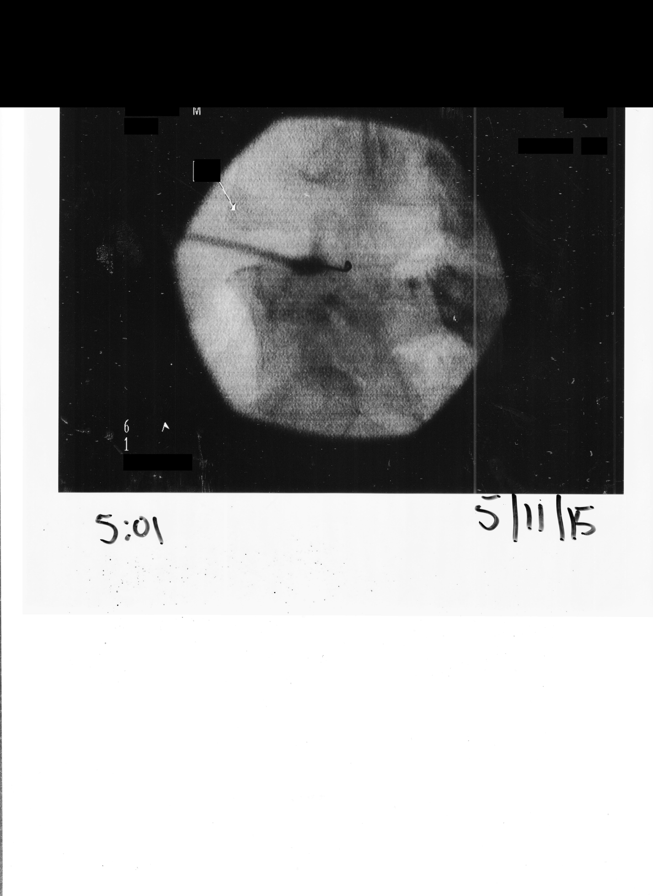

[6 of 6 positions shown; findings below may reference images not displayed]

Dr. LEONTIOS has requested bilateral facet injections at L3-4,
L4-5, and L5-S1.

FLUOROSCOPY TIME:  5 min 1 seconds

PROCEDURE:
DG FACET INJECTION LEFT; DG FACET INJECTION SECOND LEVEL LEFT; DG
FACET INJECTION; DG FACET INJECTION SECOND LEVEL RIGHT

The procedure, risks, benefits, and alternatives were explained to
the patient. Questions regarding the procedure were encouraged and
answered. The patient understands and consents to the procedure.

Right L3-[PM] INJECTION: A posterior oblique approach was taken to
the facet on the right at L3-4 using a curved 22 gauge spinal
needle. Intra-articular positioning was confirmed by injecting a
small amount of Omnipaque 180. No vascular opacification is seen.
20mg of Depo-Medrol mixed with 1 mL 1% lidocaine were instilled into
the joint. The injection resulted in mild concordant pain. The
procedure was well-tolerated.

Right L4-[PM] INJECTION: A posterior oblique approach was taken to
the facet on the right at L4-5 using a curved 22 gauge spinal
needle. Intra-articular positioning was confirmed by injecting a
small amount of Omnipaque 180. No vascular opacification is seen.
20mg of Depo-Medrol mixed with 1 mL 1% lidocaine were instilled into
the joint. The injection resulted in mild concordant pain. The
procedure was well-tolerated.

Right L5-[PM] INJECTION: A posterior oblique approach was taken
to the facet on the right at L5-S1 using a curved 22 gauge spinal
needle. Intra-articular positioning was confirmed by injecting a
small amount of Omnipaque 180. No vascular opacification is seen.
20mg of Depo-Medrol mixed with 1 mL 1% lidocaine were instilled into
the joint. The injection resulted in moderate concordant pain. The
procedure was well-tolerated.

Left L3-[PM] INJECTION: A posterior oblique approach was taken to
the facet on the left at L3-4 using a curved 22 gauge spinal needle.
Intra-articular positioning was confirmed by injecting a small
amount of Omnipaque 180. No vascular opacification is seen. 20mg of
Depo-Medrol mixed with 1 mL 1% lidocaine were instilled into the
joint. The injection resulted in mild concordant pain. The procedure
was well-tolerated.

Left L4-[PM] INJECTION: A posterior oblique approach was taken to
the facet on the left at L4-5 using a curved 22 gauge spinal needle.
Intra-articular positioning was confirmed by injecting a small
amount of Omnipaque 180. No vascular opacification is seen. 20mg of
Depo-Medrol mixed with 1 mL 1% lidocaine were instilled into the
joint. The injection resulted in moderate concordant pain. The
procedure was well-tolerated.

Left L5-[PM] INJECTION: A posterior oblique approach was taken to
the facet on the left at L5-S1 using a curved 22 gauge spinal
needle. Intra-articular positioning was confirmed by injecting a
small amount of Omnipaque 180. No vascular opacification is seen.
20mg of Depo-Medrol mixed with 1 mL 1% lidocaine were instilled into
the joint. The injection resulted in moderate concordant pain. The
procedure was well-tolerated.
IMPRESSION: Technically successful bilateral L3-4, L4-5, and L5-S1 facet
injections. The pain was most concordant on the right at L5-S1 and
on the left at L4-5 and L5-S1. The patient had minimal discomfort at
the other levels.

## 2013-08-16 MED ORDER — METHYLPREDNISOLONE ACETATE 40 MG/ML INJ SUSP (RADIOLOG
120.0000 mg | Freq: Once | INTRAMUSCULAR | Status: AC
Start: 2013-08-16 — End: 2013-08-16
  Administered 2013-08-16: 120 mg via INTRA_ARTICULAR

## 2013-08-16 MED ORDER — IOHEXOL 180 MG/ML  SOLN
1.0000 mL | Freq: Once | INTRAMUSCULAR | Status: AC | PRN
Start: 2013-08-16 — End: 2013-08-16
  Administered 2013-08-16: 1 mL via INTRA_ARTICULAR

## 2013-08-16 NOTE — Discharge Instructions (Signed)

## 2013-09-14 ENCOUNTER — Encounter: Payer: Self-pay | Admitting: Sports Medicine

## 2013-09-14 ENCOUNTER — Ambulatory Visit (INDEPENDENT_AMBULATORY_CARE_PROVIDER_SITE_OTHER): Payer: BC Managed Care – PPO | Admitting: Sports Medicine

## 2013-09-14 VITALS — BP 109/66 | HR 92 | Wt 232.0 lb

## 2013-09-14 DIAGNOSIS — M545 Low back pain, unspecified: Secondary | ICD-10-CM

## 2013-09-14 DIAGNOSIS — G8929 Other chronic pain: Secondary | ICD-10-CM

## 2013-09-14 MED ORDER — OXYCODONE-ACETAMINOPHEN 5-325 MG PO TABS: 1.0000 | ORAL_TABLET | Freq: Three times a day (TID) | ORAL | Status: DC | PRN

## 2013-09-14 NOTE — Progress Notes (Signed)
    Subjective:    CC: Followup after facet injections  HPI: This is a very pleasant 48 year-old male, we resolved his radicular pain with 2 lumbar epidurals. He continued to have axial pain, we then proceeded with bilateral L3-L4, L4-L5, and L5-S1 facet injections, he had complete pain relief of all of his axial pain for solid week, unfortunately it has recurred. Is moderate, persistent.  Past medical history, Surgical history, Family history not pertinant except as noted below, Social history, Allergies, and medications have been entered into the medical record, reviewed, and no changes needed.   Review of Systems: No fevers, chills, night sweats, weight loss, chest pain, or shortness of breath.   Objective:    General: Well Developed, well nourished, and in no acute distress.  Neuro: Alert and oriented x3, extra-ocular muscles intact, sensation grossly intact.  HEENT: Normocephalic, atraumatic, pupils equal round reactive to light, neck supple, no masses, no lymphadenopathy, thyroid nonpalpable.  Skin: Warm and dry, no rashes. Cardiac: Regular rate and rhythm, no murmurs rubs or gallops, no lower extremity edema. Respiratory: Clear to auscultation bilaterally. Not using accessory muscles, speaking in full sentences.  Impression and Recommendations:

## 2013-09-14 NOTE — Assessment & Plan Note (Signed)
L5-S1 and L2-L3 epidurals in the past have resolved all radicular symptoms. He continued to have axial pain that was facetogenic so we then proceeded with bilateral L3-4, L4-5, and L5-S1 facet injections. He had one week of complete pain relief, and unfortunately the pain has now returned. This confirms the L3-4, L4-5, and L5-S1 facet joints as his axial pain generators and we will proceed down that path way of radio frequency ablation.  Refilling Percocet the meantime.

## 2013-09-15 ENCOUNTER — Other Ambulatory Visit: Payer: Self-pay | Admitting: Sports Medicine

## 2013-09-15 DIAGNOSIS — M545 Low back pain: Principal | ICD-10-CM

## 2013-09-15 DIAGNOSIS — G8929 Other chronic pain: Secondary | ICD-10-CM

## 2013-09-17 ENCOUNTER — Ambulatory Visit: Payer: BC Managed Care – PPO | Admitting: Sports Medicine

## 2013-09-21 ENCOUNTER — Other Ambulatory Visit: Payer: Self-pay | Admitting: Sports Medicine

## 2013-09-21 ENCOUNTER — Ambulatory Visit
Admission: RE | Admit: 2013-09-21 | Discharge: 2013-09-21 | Disposition: A | Discharge status: Home / Self Care | Payer: BC Managed Care – PPO | Source: Ambulatory Visit | Attending: Sports Medicine | Admitting: Sports Medicine

## 2013-09-21 DIAGNOSIS — G8929 Other chronic pain: Secondary | ICD-10-CM

## 2013-09-21 DIAGNOSIS — M545 Low back pain: Principal | ICD-10-CM

## 2013-10-07 ENCOUNTER — Ambulatory Visit: Payer: BC Managed Care – PPO | Admitting: Sports Medicine

## 2013-10-22 ENCOUNTER — Other Ambulatory Visit: Payer: Self-pay | Admitting: Family Medicine

## 2013-11-05 ENCOUNTER — Encounter: Payer: Self-pay | Admitting: Emergency Medicine

## 2013-11-05 ENCOUNTER — Emergency Department (INDEPENDENT_AMBULATORY_CARE_PROVIDER_SITE_OTHER)
Admission: EM | Admit: 2013-11-05 | Discharge: 2013-11-05 | Disposition: A | Discharge status: Home / Self Care | Payer: BC Managed Care – PPO | Source: Home / Self Care | Attending: Family Medicine | Admitting: Family Medicine

## 2013-11-05 DIAGNOSIS — H60399 Other infective otitis externa, unspecified ear: Secondary | ICD-10-CM

## 2013-11-05 DIAGNOSIS — L03818 Cellulitis of other sites: Secondary | ICD-10-CM

## 2013-11-05 DIAGNOSIS — L02818 Cutaneous abscess of other sites: Secondary | ICD-10-CM

## 2013-11-05 DIAGNOSIS — H6001 Abscess of right external ear: Secondary | ICD-10-CM

## 2013-11-05 HISTORY — DX: Essential (primary) hypertension: I10

## 2013-11-05 MED ORDER — CLINDAMYCIN HCL 300 MG PO CAPS: 300.0000 mg | ORAL_CAPSULE | Freq: Three times a day (TID) | ORAL | Status: DC

## 2013-11-05 NOTE — ED Provider Notes (Signed)
CSN: [id number]     Arrival date & time 11/05/13  1501 History   First MD Initiated Contact with Patient 11/05/13 1606     Chief Complaint  Patient presents with  . Otalgia      HPI Comments: Patient complains of swelling and pain in his right earlobe for one day.  No fevers, chills, and sweats   Patient is a 48 y.o. male presenting with abscess. The history is provided by the patient.  Abscess Abscess location: right ear. Size:  1.5cm Abscess quality: fluctuance, painful, redness and warmth   Abscess quality: not draining and not weeping   Red streaking: no   Duration:  1 day Progression:  Worsening Pain details:    Quality:  Pressure   Severity:  Mild   Duration:  1 day   Timing:  Constant   Progression:  Worsening Chronicity:  New Relieved by:  Nothing Worsened by:  Nothing tried Ineffective treatments:  None tried Associated symptoms: no fever, no headaches and no nausea     Past Medical History  Diagnosis Date  . 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 Anxiety   . Depression   . Hypertension    Past Surgical History  Procedure Laterality Date  . Neck surgery, cervical disckectomy  11-03  . Cholecystectomy  11-95  . Appendectomy     Family History  Problem Relation Age of Onset  . Cancer Mother   . Cancer Other     brain  . Leukemia Other   . Hypertension Other    History  Substance Use Topics  . Smoking status: Never Smoker   . Smokeless tobacco: Never Used  . Alcohol Use: 18.0 oz/week    30 Cans of beer per week    Review of Systems  Constitutional: Negative for fever.  Gastrointestinal: Negative for nausea.  Neurological: Negative for headaches.  All other systems reviewed and are negative.   Allergies  Paxil  Home Medications   Prior to Admission medications   Medication Sig Start Date End Date Taking? Authorizing Provider  clindamycin (CLEOCIN) 300 MG capsule Take 1 capsule (300 mg total) by mouth 3 (three) times daily. 11/05/13    Lattie HawStephen A Beese, MD  lisinopril (PRINIVIL,ZESTRIL) 40 MG tablet TAKE 1 TABLET (40 MG TOTAL) BY MOUTH DAILY. 10/22/13   Agapito Gamesatherine D Metheney, MD  metoprolol (LOPRESSOR) 50 MG tablet TAKE 1 TABLET (50 MG TOTAL) BY MOUTH 2 (TWO) TIMES DAILY. 05/13/13   Agapito Gamesatherine D Metheney, MD  oxyCODONE-acetaminophen (PERCOCET/ROXICET) 5-325 MG per tablet Take 1 tablet by mouth every 8 (eight) hours as needed. 09/14/13   Monica Bectonhomas J Thekkekandam, MD  QUEtiapine (SEROQUEL) 200 MG tablet Take 1 tablet (200 mg total) by mouth 2 (two) times daily. 1/2 IN THE MORNING, 1 IN THE EVENING. 07/26/13   Agapito Gamesatherine D Metheney, MD  sertraline (ZOLOFT) 100 MG tablet Take 1 tablet (100 mg total) by mouth daily. 07/26/13   Agapito Gamesatherine D Metheney, MD   BP 105/70  Pulse 86  Temp(Src) 98.6 F (37 C) (Oral)  Resp 18  Ht 5\' 10"  (1.778 m)  Wt 233 lb (105.688 kg)  BMI 33.43 kg/m2  SpO2 99% Physical Exam  Nursing note and vitals reviewed. Constitutional: He appears well-developed and well-nourished. No distress.  Patient is obese (BMI 33.4)  HENT:  Head: Atraumatic.    Nose: Nose normal.  Mouth/Throat: Oropharynx is clear and moist.  Right earlobe has a fluctuant, erythematous, tender, cystic lesion posteriorly as noted  on diagram.    Eyes: Conjunctivae and EOM are normal. Pupils are equal, round, and reactive to light.  Neck: Neck supple.  Lymphadenopathy:    He has no cervical adenopathy.    ED Course  Procedures  Incise and drain cyst/abscess Risks and benefits of procedure explained to patient and verbal consent obtained.  Using sterile technique and local anesthesia with 1% lidocaine with epinephrine, cleansed affected area with Betadine and saline. Identified the most fluctuant area of lesion and incised with #11 blade.  Expressed blood and purulent material.  Wound too shallow for insertion of packing.  Bandage applied.  Patient tolerated well     Labs Reviewed  WOUND CULTURE         MDM   1. Abscess, earlobe, right     Wound culture pending. Begin clindamycin.  Recheck in 48 hours. If symptoms become significantly worse during the night or over the weekend, proceed to the local emergency room.     Lattie Haw, MD 11/07/13 [phone]

## 2013-11-05 NOTE — Discharge Instructions (Signed)
Change dressing daily.  Apply heating pad 3 or 4 times daily.  May take Tylenol for pain. If symptoms become significantly worse during the night or over the weekend, proceed to the local emergency room.    Abscess An abscess is an infected area that contains a collection of pus and debris.It can occur in almost any part of the body. An abscess is also known as a furuncle or boil. CAUSES  An abscess occurs when tissue gets infected. This can occur from blockage of oil or sweat glands, infection of hair follicles, or a minor injury to the skin. As the body tries to fight the infection, pus collects in the area and creates pressure under the skin. This pressure causes pain. People with weakened immune systems have difficulty fighting infections and get certain abscesses more often.  SYMPTOMS Usually an abscess develops on the skin and becomes a painful mass that is red, warm, and tender. If the abscess forms under the skin, you may feel a moveable soft area under the skin. Some abscesses break open (rupture) on their own, but most will continue to get worse without care. The infection can spread deeper into the body and eventually into the bloodstream, causing you to feel ill.  DIAGNOSIS  Your caregiver will take your medical history and perform a physical exam. A sample of fluid may also be taken from the abscess to determine what is causing your infection. TREATMENT  Your caregiver may prescribe antibiotic medicines to fight the infection. However, taking antibiotics alone usually does not cure an abscess. Your caregiver may need to make a small cut (incision) in the abscess to drain the pus. In some cases, gauze is packed into the abscess to reduce pain and to continue draining the area. HOME CARE INSTRUCTIONS   Only take over-the-counter or prescription medicines for pain, discomfort, or fever as directed by your caregiver.  If you were prescribed antibiotics, take them as directed. Finish them  even if you start to feel better.  If gauze is used, follow your caregiver's directions for changing the gauze.  To avoid spreading the infection:  Keep your draining abscess covered with a bandage.  Wash your hands well.  Do not share personal care items, towels, or whirlpools with others.  Avoid skin contact with others.  Keep your skin and clothes clean around the abscess.  Keep all follow-up appointments as directed by your caregiver. SEEK MEDICAL CARE IF:   You have increased pain, swelling, redness, fluid drainage, or bleeding.  You have muscle aches, chills, or a general ill feeling.  You have a fever. MAKE SURE YOU:   Understand these instructions.  Will watch your condition.  Will get help right away if you are not doing well or get worse. Document Released: 01/02/2005 Document Revised: 09/24/2011 Document Reviewed: 06/07/2011 [facility] Patient Information 2015 [employer], [place]. This information is not intended to replace advice given to you by your health care provider. Make sure you discuss any questions you have with your health care provider.

## 2013-11-05 NOTE — ED Notes (Signed)
Pt c/o RT ear pain and swelling x 1 day. Denies fever.

## 2013-11-07 ENCOUNTER — Telehealth: Payer: Self-pay | Admitting: Emergency Medicine

## 2013-11-08 LAB — WOUND CULTURE: Gram Stain: NONE SEEN

## 2013-11-09 ENCOUNTER — Telehealth: Payer: Self-pay

## 2013-11-09 NOTE — ED Notes (Signed)
Left a message on voice mail asking how patient is feeling and advising to call back with any questions or concerns.  

## 2013-11-26 ENCOUNTER — Other Ambulatory Visit: Payer: Self-pay | Admitting: Family Medicine

## 2013-12-01 ENCOUNTER — Telehealth: Payer: Self-pay

## 2013-12-01 DIAGNOSIS — G8929 Other chronic pain: Secondary | ICD-10-CM

## 2013-12-01 DIAGNOSIS — M545 Low back pain, unspecified: Secondary | ICD-10-CM

## 2013-12-01 NOTE — Telephone Encounter (Signed)
patient called stated that he can not get appt til next week and she would like to get something for pain. Rhonda Cunningham,CMA

## 2013-12-02 MED ORDER — OXYCODONE-ACETAMINOPHEN 5-325 MG PO TABS: 1.0000 | ORAL_TABLET | Freq: Three times a day (TID) | ORAL | Status: DC | PRN

## 2013-12-02 NOTE — Telephone Encounter (Signed)
Spoke to patient advised him that Rx is ready for pickup and that Rx will be limited. Rhonda Cunningham,CMA

## 2013-12-02 NOTE — Telephone Encounter (Signed)
Rx for Percocet in my box. We will limit these prescriptions of course.

## 2013-12-05 ENCOUNTER — Other Ambulatory Visit: Payer: Self-pay | Admitting: Family Medicine

## 2013-12-08 ENCOUNTER — Encounter: Payer: Self-pay | Admitting: Sports Medicine

## 2013-12-08 ENCOUNTER — Ambulatory Visit (INDEPENDENT_AMBULATORY_CARE_PROVIDER_SITE_OTHER): Payer: BC Managed Care – PPO | Admitting: Sports Medicine

## 2013-12-08 VITALS — BP 117/76 | HR 92 | Ht 70.0 in | Wt 234.0 lb

## 2013-12-08 DIAGNOSIS — G8929 Other chronic pain: Secondary | ICD-10-CM

## 2013-12-08 DIAGNOSIS — M545 Low back pain, unspecified: Secondary | ICD-10-CM | POA: Diagnosis not present

## 2013-12-08 NOTE — Assessment & Plan Note (Addendum)
Christopher Lloyd had a good response to medial branch blocks for the L3-L4, L4-L5, and L5-S1 facet joints bilaterally, with complete resolution of pain temporarily. This makes him an excellent candidate for radiotherapy ablation however this was denied by [employer]. We did recently provide a prescription of Percocet, I would like him set up with pain management in the meantime while we get this approved.  Discussed with [facility] physician, it sounds as though they did not have records of him doing any physician directed rehabilitation. We will fax these records and radiofrequency ablation will be approved.

## 2013-12-08 NOTE — Progress Notes (Signed)
  Subjective:    CC: Followup  HPI: Lumbar spondylosis: Good response to bilateral L3-L4, L4-L5, L5-S1 facet injections, subsequently had a good response to medial branch blocks at these levels, unfortunately insurance company denied his radiofrequency ablation quite surprisingly. I spoke to his insurance company physician today, and it was a simple clerical issue due to some [employer] operational officer. All they need to use my notes show that he has had 6 weeks of a physician directed home rehabilitation program  Past medical history, Surgical history, Family history not pertinant except as noted below, Social history, Allergies, and medications have been entered into the medical record, reviewed, and no changes needed.   Review of Systems: No fevers, chills, night sweats, weight loss, chest pain, or shortness of breath.   Objective:    General: Well Developed, well nourished, and in no acute distress.  Neuro: Alert and oriented x3, extra-ocular muscles intact, sensation grossly intact.  HEENT: Normocephalic, atraumatic, pupils equal round reactive to light, neck supple, no masses, no lymphadenopathy, thyroid nonpalpable.  Skin: Warm and dry, no rashes. Cardiac: Regular rate and rhythm, no murmurs rubs or gallops, no lower extremity edema.  Respiratory: Clear to auscultation bilaterally. Not using accessory muscles, speaking in full sentences.  Impression and Recommendations:

## 2013-12-22 ENCOUNTER — Encounter: Payer: Self-pay | Admitting: Family Medicine

## 2013-12-22 ENCOUNTER — Ambulatory Visit (INDEPENDENT_AMBULATORY_CARE_PROVIDER_SITE_OTHER): Payer: BC Managed Care – PPO | Admitting: Family Medicine

## 2013-12-22 VITALS — BP 123/84 | HR 90 | Wt 233.0 lb

## 2013-12-22 DIAGNOSIS — F341 Dysthymic disorder: Secondary | ICD-10-CM | POA: Diagnosis not present

## 2013-12-22 DIAGNOSIS — M545 Low back pain, unspecified: Secondary | ICD-10-CM | POA: Diagnosis not present

## 2013-12-22 DIAGNOSIS — I10 Essential (primary) hypertension: Secondary | ICD-10-CM

## 2013-12-22 DIAGNOSIS — F418 Other specified anxiety disorders: Secondary | ICD-10-CM

## 2013-12-22 DIAGNOSIS — G8929 Other chronic pain: Secondary | ICD-10-CM

## 2013-12-22 MED ORDER — OXYCODONE-ACETAMINOPHEN 5-325 MG PO TABS: 1.0000 | ORAL_TABLET | Freq: Three times a day (TID) | ORAL | Status: DC | PRN

## 2013-12-22 MED ORDER — LISINOPRIL 40 MG PO TABS: ORAL_TABLET | ORAL | Status: AC

## 2013-12-22 MED ORDER — METOPROLOL TARTRATE 50 MG PO TABS: ORAL_TABLET | ORAL | Status: AC

## 2013-12-22 MED ORDER — QUETIAPINE FUMARATE 200 MG PO TABS: 200.0000 mg | ORAL_TABLET | Freq: Two times a day (BID) | ORAL | Status: DC

## 2013-12-22 MED ORDER — SERTRALINE HCL 100 MG PO TABS: 100.0000 mg | ORAL_TABLET | Freq: Every day | ORAL | Status: DC

## 2013-12-22 NOTE — Assessment & Plan Note (Signed)
Well-controlled. Continue current regimen. Followup in 4 months. Due for CMP and fasting lipid panel.

## 2013-12-22 NOTE — Progress Notes (Signed)
   Subjective:    Patient ID: Christopher Lloyd, male    DOB: March 28, 1966, 47 y.o.   MRN: [id number]  Hypertension   Hypertension- Pt denies chest pain, SOB, dizziness, or heart palpitations.  Taking meds as directed w/o problems.  Denies medication side effects.  Very active for a living.    F/U depression/anxiety - last seen 4 months ago. At that time I did increase his Seroquel. He has not had power for almost a month so was under a lot of financial and social stressors. He still complains of feeling down and depressed for several days a week and difficulty with sleep and energy. He also complains of not being able to control her stop his worrying and difficulty relaxing more than half the days.  Chronic low back pain-he's been seeing Dr. Rodney Langton for this. He did have injections done for his back pain which worked well but only for a brief period time. He is actually scheduled for radiofrequency ablation later this week. He would like a refill on his pain medication. He was last on August 27. For 30 tabs.  Review of Systems     Objective:   Physical Exam  Constitutional: He is oriented to person, place, and time. He appears well-developed and well-nourished.  HENT:  Head: Normocephalic and atraumatic.  Neck: Neck supple. No thyromegaly present.  Cardiovascular: Normal rate, regular rhythm and normal heart sounds.   No carotid bruits  Pulmonary/Chest: Effort normal and breath sounds normal.  Musculoskeletal: He exhibits no edema.  Lymphadenopathy:    He has no cervical adenopathy.  Neurological: He is alert and oriented to person, place, and time.  Skin: Skin is warm and dry.  Psychiatric: He has a normal mood and affect. His behavior is normal.          Assessment & Plan:  Depression/anxiety-PHQ 9 score of 4 today. Previous was 6. Gad 7 score of 8 today. Previous was benign. He does feel like he is happy with his current regimen dosing with the Seroquel and the  sertraline. We will continue current regimen and I'll see him back in 4 months. Certainly consider counseling if he feels like his mood is decreasing. He still does not have power home and says it probably won't be any time soon.   Chronic low back pain-I. did refill his Percocet today for 45 tabs. I reminded him that all future refills when he did come from Dr. Benjamin Stain.

## 2013-12-23 LAB — COMPLETE METABOLIC PANEL WITH GFR
ALK PHOS: 100 U/L (ref 39–117)
ALT: 16 U/L (ref 0–53)
AST: 19 U/L (ref 0–37)
Albumin: 4.2 g/dL (ref 3.5–5.2)
BUN: 7 mg/dL (ref 6–23)
CO2: 24 mEq/L (ref 19–32)
CREATININE: 0.8 mg/dL (ref 0.50–1.35)
Calcium: 9 mg/dL (ref 8.4–10.5)
Chloride: 103 mEq/L (ref 96–112)
GFR, Est African American: >89 mL/min
Glucose, Bld: 77 mg/dL (ref 70–99)
Potassium: 4.5 mEq/L (ref 3.5–5.3)
SODIUM: 136 meq/L (ref 135–145)
TOTAL PROTEIN: 6.6 g/dL (ref 6.0–8.3)
Total Bilirubin: 0.8 mg/dL (ref 0.2–1.2)

## 2013-12-23 LAB — LIPID PANEL
CHOL/HDL RATIO: 3.4 ratio
Cholesterol: 162 mg/dL (ref 0–200)
HDL: 48 mg/dL (ref >39)
LDL Cholesterol: 54 mg/dL (ref 0–99)
TRIGLYCERIDES: 302 mg/dL — AB (ref <150)
VLDL: 60 mg/dL — ABNORMAL HIGH (ref 0–40)

## 2013-12-28 ENCOUNTER — Other Ambulatory Visit: Payer: BC Managed Care – PPO

## 2013-12-31 ENCOUNTER — Other Ambulatory Visit: Payer: Self-pay | Admitting: Family Medicine

## 2013-12-31 ENCOUNTER — Ambulatory Visit
Admission: RE | Admit: 2013-12-31 | Discharge: 2013-12-31 | Disposition: A | Discharge status: Home / Self Care | Payer: BC Managed Care – PPO | Source: Ambulatory Visit | Attending: Sports Medicine | Admitting: Sports Medicine

## 2013-12-31 ENCOUNTER — Other Ambulatory Visit: Payer: Self-pay | Admitting: Sports Medicine

## 2013-12-31 VITALS — BP 125/73 | HR 65 | Temp 98.1°F | Resp 12

## 2013-12-31 DIAGNOSIS — M545 Low back pain: Principal | ICD-10-CM

## 2013-12-31 DIAGNOSIS — G8929 Other chronic pain: Secondary | ICD-10-CM

## 2013-12-31 MED ORDER — METHYLPREDNISOLONE ACETATE 40 MG/ML INJ SUSP (RADIOLOG: 120.0000 mg | Freq: Once | INTRAMUSCULAR | Status: DC

## 2013-12-31 MED ORDER — KETOROLAC TROMETHAMINE 30 MG/ML IJ SOLN
30.0000 mg | Freq: Once | INTRAMUSCULAR | Status: AC
  Administered 2013-12-31: 30 mg via INTRAVENOUS

## 2013-12-31 MED ORDER — CEFAZOLIN SODIUM-DEXTROSE 2-3 GM-% IV SOLR
2.0000 g | Freq: Once | INTRAVENOUS | Status: AC
Start: 2013-12-31 — End: 2013-12-31
  Administered 2013-12-31: 2 g via INTRAVENOUS

## 2013-12-31 MED ORDER — MIDAZOLAM HCL 2 MG/2ML IJ SOLN
1.0000 mg | INTRAMUSCULAR | Status: DC | PRN
  Administered 2013-12-31: 1 mg via INTRAVENOUS

## 2013-12-31 MED ORDER — SODIUM CHLORIDE 0.9 % IV SOLN
Freq: Once | INTRAVENOUS | Status: AC
  Administered 2013-12-31: 07:00:00 via INTRAVENOUS

## 2013-12-31 MED ORDER — FENTANYL CITRATE 0.05 MG/ML IJ SOLN
25.0000 ug | INTRAMUSCULAR | Status: DC | PRN
  Administered 2013-12-31: 75 ug via INTRAVENOUS
  Administered 2013-12-31: 25 ug via INTRAVENOUS

## 2013-12-31 NOTE — Discharge Instructions (Signed)
Radio Frequency Ablation Post Procedure Discharge Instructions ° °1. May resume a regular diet and any medications that you routinely take (including pain medications). °2. No driving day of procedure. °3. Upon discharge go home and rest for at least 4 hours.  May use an ice pack as needed to injection sites on back. °4. Remove bandades later, today. ° ° ° °Please contact our office at [phone] for the following symptoms: ° °· Fever greater than 100 degrees °· Increased swelling, pain, or redness at injection site. ° ° °Thank you for visiting [facility]. °

## 2013-12-31 NOTE — Progress Notes (Signed)
Sedation time one hour and four minutes for RFA.  jkl

## 2014-02-28 ENCOUNTER — Ambulatory Visit (INDEPENDENT_AMBULATORY_CARE_PROVIDER_SITE_OTHER): Payer: BC Managed Care – PPO | Admitting: Physician Assistant

## 2014-02-28 ENCOUNTER — Encounter: Payer: Self-pay | Admitting: Physician Assistant

## 2014-02-28 VITALS — BP 123/84 | HR 76 | Temp 97.7°F | Ht 70.0 in | Wt 238.0 lb

## 2014-02-28 DIAGNOSIS — R6884 Jaw pain: Secondary | ICD-10-CM | POA: Diagnosis not present

## 2014-02-28 DIAGNOSIS — H9202 Otalgia, left ear: Secondary | ICD-10-CM | POA: Diagnosis not present

## 2014-02-28 MED ORDER — PREDNISONE 20 MG PO TABS: ORAL_TABLET | ORAL | Status: DC

## 2014-02-28 NOTE — Patient Instructions (Signed)
flonase nasal 2 sprays each nostril.  Prednisone pack.  Ibuprofen 800mg  up to three times a day for next 5 days.

## 2014-03-01 ENCOUNTER — Ambulatory Visit: Payer: BC Managed Care – PPO | Admitting: Family Medicine

## 2014-03-01 NOTE — Progress Notes (Signed)
   Subjective:    Patient ID: Christopher Lloyd, male    DOB: 04/06/1966, 48 y.o.   MRN: [id number]  HPI Pt is a 48 yo male who presents to the clinic with left ear only pain for last 2 weeks. Denies any ST, sinus pressure, cough, SOB, wheezing. No fever, chills. Not tried anything to make better. Pain seems to be radiating into jaw at this point.    Review of Systems  All other systems reviewed and are negative.      Objective:   Physical Exam  Constitutional: He is oriented to person, place, and time. He appears well-developed and well-nourished.  HENT:  Head: Normocephalic and atraumatic.  Right Ear: External ear normal.  Left Ear: External ear normal.  Nose: Nose normal.  Mouth/Throat: Oropharynx is clear and moist. No oropharyngeal exudate.  Bilateral external canals look great.  No pain bilaterally over tragus or with advancement of otoscope.  Bilateral TM's clear with good light reflex. No blood or pus.  Some air bubbles seen behind bilateral TM's.   No pain with palpation over TMJ bilaterally. With open jaw test TMJ did not pop out of place.   Eyes: Conjunctivae are normal. Right eye exhibits no discharge. Left eye exhibits no discharge.  Neck: Normal range of motion. Neck supple.  Cardiovascular: Normal rate, regular rhythm and normal heart sounds.   Pulmonary/Chest: Effort normal and breath sounds normal. He has no wheezes.  Lymphadenopathy:    He has no cervical adenopathy.  Neurological: He is alert and oriented to person, place, and time.  Skin: Skin is dry.  Psychiatric: He has a normal mood and affect. His behavior is normal.          Assessment & Plan:  Left ear and jaw pain- unclear etiology perhaps eustachian tube dysfunction vs TMJ inflammation. Gave info on both. Reassured pt to infection. Prednisone taper given. Encouraged flonase 2 sprays each nostril twice a day. Ibuprofen for pain for next 2-3 days. Don't chew gum. Follow up if pain worsening or  changing. Written out of work for next 2 days. He works in a really cold environment that could be causing some inflammation.

## 2014-04-20 ENCOUNTER — Ambulatory Visit (INDEPENDENT_AMBULATORY_CARE_PROVIDER_SITE_OTHER): Payer: BLUE CROSS/BLUE SHIELD | Admitting: Family Medicine

## 2014-04-20 ENCOUNTER — Encounter: Payer: Self-pay | Admitting: Family Medicine

## 2014-04-20 VITALS — BP 129/78 | HR 87 | Ht 70.0 in | Wt 242.0 lb

## 2014-04-20 DIAGNOSIS — M10032 Idiopathic gout, left wrist: Secondary | ICD-10-CM

## 2014-04-20 DIAGNOSIS — I10 Essential (primary) hypertension: Secondary | ICD-10-CM

## 2014-04-20 MED ORDER — PREDNISONE 20 MG PO TABS: ORAL_TABLET | ORAL | Status: DC

## 2014-04-20 NOTE — Patient Instructions (Signed)

## 2014-04-20 NOTE — Progress Notes (Signed)
   Subjective:    Patient ID: Christopher Lloyd, male    DOB: 11-04-1965, 49 y.o.   MRN: [id number]  HPI Patient comes in today for acute gout flare in his left wrist. He says normally it affects his feet but this time it affected his wrist. It started yesterday. It's red and swollen. He's been taking ibuprofen for relief. Last uric acid level was in 2013 and was 6.8 at that time.last flare was about 1-2 weeks ago and was in his foot. But was able to get it to calm down with Advil. No trauma or injury.  He is left handed  Hypertension- Pt denies chest pain, SOB, dizziness, or heart palpitations.  Taking meds as directed w/o problems.  Denies medication side effects.     Review of Systems     Objective:   Physical Exam  Constitutional: He is oriented to person, place, and time. He appears well-developed and well-nourished.  HENT:  Head: Normocephalic and atraumatic.  Right Ear: External ear normal.  Left Ear: External ear normal.  Nose: Nose normal.  Mouth/Throat: Oropharynx is clear and moist.  Eyes: Conjunctivae are normal.  Neck: Normal range of motion. Neck supple.  Cardiovascular: Normal rate, regular rhythm, normal heart sounds and intact distal pulses.   Pulmonary/Chest: Effort normal and breath sounds normal.  Musculoskeletal:  Left wrist is very swollen, warm and red.  Dec ROM and very tender to touch.  Fingers with NROM  Neurological: He is alert and oriented to person, place, and time. He has normal reflexes.  Skin: Skin is warm and dry.  Psychiatric: He has a normal mood and affect. His behavior is normal. Judgment and thought content normal.          Assessment & Plan:  Gout flare - Will tx acutely with prednisone for 10 days.  Will recheck uric acid level in 3-4 weeks. consider starting allopurinol and colchicine x 6 months and then continue the allopurinol. We discussed this today.    HTN - well controlled. Continue current regimen. Follow up in 6 months.

## 2014-05-31 ENCOUNTER — Encounter: Payer: Self-pay | Admitting: Sports Medicine

## 2014-05-31 ENCOUNTER — Ambulatory Visit (INDEPENDENT_AMBULATORY_CARE_PROVIDER_SITE_OTHER): Payer: BLUE CROSS/BLUE SHIELD | Admitting: Sports Medicine

## 2014-05-31 VITALS — BP 124/77 | HR 83 | Ht 70.0 in | Wt 244.0 lb

## 2014-05-31 DIAGNOSIS — F418 Other specified anxiety disorders: Secondary | ICD-10-CM | POA: Diagnosis not present

## 2014-05-31 DIAGNOSIS — M5030 Other cervical disc degeneration, unspecified cervical region: Secondary | ICD-10-CM

## 2014-05-31 DIAGNOSIS — M545 Low back pain: Secondary | ICD-10-CM

## 2014-05-31 DIAGNOSIS — G8929 Other chronic pain: Secondary | ICD-10-CM | POA: Diagnosis not present

## 2014-05-31 MED ORDER — GABAPENTIN 300 MG PO CAPS: ORAL_CAPSULE | ORAL | Status: AC

## 2014-05-31 MED ORDER — DULOXETINE HCL 30 MG PO CPEP: 30.0000 mg | ORAL_CAPSULE | Freq: Every day | ORAL | Status: DC

## 2014-05-31 NOTE — Patient Instructions (Signed)
Decrease Zoloft to one half tab daily for a week and one quarter tab daily for a week then stop Start Cymbalta tomorrow

## 2014-05-31 NOTE — Assessment & Plan Note (Signed)
Facet mediated pain at the L3-L4, L4-L5, and L5-S1 levels has resolved after radio frequency ablation in September.

## 2014-05-31 NOTE — Assessment & Plan Note (Signed)
Depression is not controlled with Zoloft. I do think we need to add some serotonin norepinephrine reuptake inhibition. Switching to Cymbalta.

## 2014-05-31 NOTE — Assessment & Plan Note (Signed)
Known adjacent level disease post cervical fusion.  At this point we are going to obtain an MRI with and without IV contrast for further evaluation of the adjacent level disease. We will also add gabapentin, and Cymbalta has below

## 2014-05-31 NOTE — Progress Notes (Signed)
  Subjective:    CC: Follow-up  HPI: Lumbar spondylosis: Multilevel lumbar facet disease, most recently he had L3-S1 facet radiofrequency ablation bilaterally, his facet mediated pain is now resolved, he does continue to have some pain.  Neck pain: Localize between the shoulder blades, worse with left-sided rotation, he does have a history of a right-sided cervical ACDF, unknown level, in the distant past he did have an MRI that showed some adjacent level disease, no further details are known. Pain is radiating down the left arm and upper shoulder and a C5 distribution, no lower extremity symptoms.  Depression: Ells me his mood is depressed. He understands that depression can make it difficult to treat musculoskeletal symptoms.  Past medical history, Surgical history, Family history not pertinant except as noted below, Social history, Allergies, and medications have been entered into the medical record, reviewed, and no changes needed.   Review of Systems: No fevers, chills, night sweats, weight loss, chest pain, or shortness of breath.   Objective:    General: Well Developed, well nourished, and in no acute distress.  Neuro: Alert and oriented x3, extra-ocular muscles intact, sensation grossly intact.  HEENT: Normocephalic, atraumatic, pupils equal round reactive to light, neck supple, no masses, no lymphadenopathy, thyroid nonpalpable.  Skin: Warm and dry, no rashes. Cardiac: Regular rate and rhythm, no murmurs rubs or gallops, no lower extremity edema.  Respiratory: Clear to auscultation bilaterally. Not using accessory muscles, speaking in full sentences.  Impression and Recommendations:

## 2014-06-01 ENCOUNTER — Telehealth: Payer: Self-pay

## 2014-06-01 NOTE — Telephone Encounter (Signed)
PA required for MRI cervical spine with and without contrast - [id number]- expires 06/30/2014

## 2014-06-08 LAB — BASIC METABOLIC PANEL WITH GFR
BUN: 7 mg/dL (ref 6–23)
Calcium: 8.9 mg/dL (ref 8.4–10.5)

## 2014-06-08 LAB — BASIC METABOLIC PANEL
CO2: 23 mEq/L (ref 19–32)
Chloride: 100 mEq/L (ref 96–112)
Creat: 0.8 mg/dL (ref 0.50–1.35)
Glucose, Bld: 135 mg/dL — ABNORMAL HIGH (ref 70–99)
Potassium: 4.4 mEq/L (ref 3.5–5.3)
Sodium: 135 mEq/L (ref 135–145)

## 2014-06-13 ENCOUNTER — Ambulatory Visit (INDEPENDENT_AMBULATORY_CARE_PROVIDER_SITE_OTHER): Payer: BLUE CROSS/BLUE SHIELD

## 2014-06-13 DIAGNOSIS — M4602 Spinal enthesopathy, cervical region: Secondary | ICD-10-CM

## 2014-06-13 DIAGNOSIS — M4803 Spinal stenosis, cervicothoracic region: Secondary | ICD-10-CM

## 2014-06-13 DIAGNOSIS — M4804 Spinal stenosis, thoracic region: Secondary | ICD-10-CM

## 2014-06-13 DIAGNOSIS — M4802 Spinal stenosis, cervical region: Secondary | ICD-10-CM

## 2014-06-13 DIAGNOSIS — M4603 Spinal enthesopathy, cervicothoracic region: Secondary | ICD-10-CM

## 2014-06-13 DIAGNOSIS — M4322 Fusion of spine, cervical region: Secondary | ICD-10-CM

## 2014-06-13 DIAGNOSIS — M5124 Other intervertebral disc displacement, thoracic region: Secondary | ICD-10-CM

## 2014-06-13 DIAGNOSIS — M2578 Osteophyte, vertebrae: Secondary | ICD-10-CM

## 2014-06-13 MED ORDER — GADOBENATE DIMEGLUMINE 529 MG/ML IV SOLN
20.0000 mL | Freq: Once | INTRAVENOUS | Status: AC | PRN
  Administered 2014-06-13: 20 mL via INTRAVENOUS

## 2014-06-21 ENCOUNTER — Encounter: Payer: Self-pay | Admitting: Sports Medicine

## 2014-06-21 ENCOUNTER — Ambulatory Visit (INDEPENDENT_AMBULATORY_CARE_PROVIDER_SITE_OTHER): Payer: BLUE CROSS/BLUE SHIELD | Admitting: Sports Medicine

## 2014-06-21 VITALS — BP 117/83 | HR 117 | Wt 245.0 lb

## 2014-06-21 DIAGNOSIS — M545 Low back pain, unspecified: Secondary | ICD-10-CM

## 2014-06-21 DIAGNOSIS — M5030 Other cervical disc degeneration, unspecified cervical region: Secondary | ICD-10-CM | POA: Diagnosis not present

## 2014-06-21 DIAGNOSIS — F418 Other specified anxiety disorders: Secondary | ICD-10-CM

## 2014-06-21 DIAGNOSIS — G8929 Other chronic pain: Secondary | ICD-10-CM | POA: Diagnosis not present

## 2014-06-21 MED ORDER — OXYCODONE-ACETAMINOPHEN 10-325 MG PO TABS: 1.0000 | ORAL_TABLET | Freq: Three times a day (TID) | ORAL | Status: DC | PRN

## 2014-06-21 MED ORDER — DULOXETINE HCL 60 MG PO CPEP: 60.0000 mg | ORAL_CAPSULE | Freq: Every day | ORAL | Status: DC

## 2014-06-21 NOTE — Assessment & Plan Note (Signed)
History of a C5-C7 fusion with adjacent level disease at C4-C5 that is fairly significant. He does have left-sided C5 and C6 radicular symptoms. We are going to proceed with a left-sided cervical epidural, I would also like him to touch base with Dr. Yevette Edwardsumonski with spine surgery as a second opinion.

## 2014-06-21 NOTE — Assessment & Plan Note (Signed)
Multilevel spondylosis with central canal stenosis, good response to L3-S1 facet radiofrequency ablation bilaterally. Facet mediated pain is gone however I think he still has significant disc/spinal stenosis mediated pain.

## 2014-06-21 NOTE — Progress Notes (Signed)
  Subjective:    CC: Follow-up MRI  HPI: Christopher Lloyd returns for follow-up of his severe neck pain, he has a history of a C5-C7 fusion, has been having persistent left-sided C5 and C6 radiculopathy, failed physical therapy so we obtain an MRI of the results of which will be dictated below.  Low back pain: Lumbar spondylosis with multilevel degenerative disc disease and facet arthritis, responded well to multilevel lumbar facet radio frequency ablation, and facet mediated pain has resolved, continues to have some pain that is likely related to his multilevel spinal stenosis. He is amenable to talk to the surgeon regarding his persistent symptoms, he has not responded to multiple epidurals.  Past medical history, Surgical history, Family history not pertinant except as noted below, Social history, Allergies, and medications have been entered into the medical record, reviewed, and no changes needed.   Review of Systems: No fevers, chills, night sweats, weight loss, chest pain, or shortness of breath.   Objective:    General: Well Developed, well nourished, and in no acute distress.  Neuro: Alert and oriented x3, extra-ocular muscles intact, sensation grossly intact.  HEENT: Normocephalic, atraumatic, pupils equal round reactive to light, neck supple, no masses, no lymphadenopathy, thyroid nonpalpable.  Skin: Warm and dry, no rashes. Cardiac: Regular rate and rhythm, no murmurs rubs or gallops, no lower extremity edema.  Respiratory: Clear to auscultation bilaterally. Not using accessory muscles, speaking in full sentences.  MRI shows a C5-C7 fusion is intact, there is a large C4-C5 disc extrusion causing mild central canal stenosis and indenting the cord.  Impression and Recommendations:

## 2014-06-21 NOTE — Assessment & Plan Note (Signed)
Increasing Cymbalta to 60 mg daily. 

## 2014-06-29 ENCOUNTER — Ambulatory Visit
Admission: RE | Admit: 2014-06-29 | Discharge: 2014-06-29 | Disposition: A | Discharge status: Home / Self Care | Payer: BLUE CROSS/BLUE SHIELD | Source: Ambulatory Visit | Attending: Sports Medicine | Admitting: Sports Medicine

## 2014-06-29 MED ORDER — IOHEXOL 300 MG/ML  SOLN
1.0000 mL | Freq: Once | INTRAMUSCULAR | Status: AC | PRN
  Administered 2014-06-29: 1 mL via EPIDURAL

## 2014-06-29 MED ORDER — TRIAMCINOLONE ACETONIDE 40 MG/ML IJ SUSP (RADIOLOGY)
60.0000 mg | Freq: Once | INTRAMUSCULAR | Status: AC
  Administered 2014-06-29: 60 mg via EPIDURAL

## 2014-06-29 MED ORDER — IOHEXOL 300 MG/ML  SOLN: 1.0000 mL | Freq: Once | INTRAMUSCULAR | Status: AC | PRN

## 2014-07-06 ENCOUNTER — Other Ambulatory Visit: Payer: Self-pay | Admitting: Orthopedic Surgery

## 2014-07-20 ENCOUNTER — Encounter (HOSPITAL_COMMUNITY): Admission: RE | Payer: Self-pay | Source: Ambulatory Visit

## 2014-07-20 ENCOUNTER — Ambulatory Visit (HOSPITAL_COMMUNITY)
Admission: RE | Admit: 2014-07-20 | Payer: BLUE CROSS/BLUE SHIELD | Source: Ambulatory Visit | Admitting: Orthopedic Surgery

## 2014-07-20 SURGERY — ANTERIOR CERVICAL DECOMPRESSION/DISCECTOMY FUSION 1 LEVEL
Anesthesia: General

## 2014-07-29 ENCOUNTER — Ambulatory Visit (INDEPENDENT_AMBULATORY_CARE_PROVIDER_SITE_OTHER): Payer: BLUE CROSS/BLUE SHIELD | Admitting: Sports Medicine

## 2014-07-29 ENCOUNTER — Encounter: Payer: Self-pay | Admitting: Sports Medicine

## 2014-07-29 VITALS — BP 122/82 | HR 87 | Ht 70.0 in | Wt 247.0 lb

## 2014-07-29 DIAGNOSIS — F418 Other specified anxiety disorders: Secondary | ICD-10-CM

## 2014-07-29 DIAGNOSIS — M5030 Other cervical disc degeneration, unspecified cervical region: Secondary | ICD-10-CM | POA: Diagnosis not present

## 2014-07-29 MED ORDER — OXYCODONE-ACETAMINOPHEN 10-325 MG PO TABS: 1.0000 | ORAL_TABLET | Freq: Three times a day (TID) | ORAL | Status: DC | PRN

## 2014-07-29 NOTE — Progress Notes (Signed)
  Subjective:    CC: Follow-up  HPI: Christopher Lloyd returns, he has severe cervical degenerative disc disease, with left-sided radiculopathy, he did not respond to a cervical epidural. He has also not had responses to formal physical therapy or multiple medications. He was seen by Dr. Yevette Edwardsumonski who did suggest receding to ACDF, however he was unable to afford the upfront cost.  Lumbar spondylosis: Did extremely well with multilevel facet radiofrequency ablation with continued relief of facet mediated type pain, unfortunately continues to have some degree of discogenic-type pain.  Depression: Doing okay but with persistent symptoms of defeat on 60 mg of Cymbalta. No suicidal or homicidal ideation.  Past medical history, Surgical history, Family history not pertinant except as noted below, Social history, Allergies, and medications have been entered into the medical record, reviewed, and no changes needed.   Review of Systems: No fevers, chills, night sweats, weight loss, chest pain, or shortness of breath.   Objective:    General: Well Developed, well nourished, and in no acute distress.  Neuro: Alert and oriented x3, extra-ocular muscles intact, sensation grossly intact.  HEENT: Normocephalic, atraumatic, pupils equal round reactive to light, neck supple, no masses, no lymphadenopathy, thyroid nonpalpable.  Skin: Warm and dry, no rashes. Cardiac: Regular rate and rhythm, no murmurs rubs or gallops, no lower extremity edema.  Respiratory: Clear to auscultation bilaterally. Not using accessory muscles, speaking in full sentences.  Impression and Recommendations:

## 2014-07-29 NOTE — Assessment & Plan Note (Addendum)
History of a C5-C7 fusion that appears solid. Now with left-sided upper cervical radiculopathy, and adjacent level disease at the C4-C5 level that does appear to cause some indentation of the cord as well as foraminal stenosis on the left. This corresponds to his symptoms, he had a temporary response to left-sided cervical epidural. He did see Dr. Yevette Edwardsumonski with spine surgery who has recommended C4-C5 ACDF, he was unable to do the upfront cost, we are going to [facility] in the hopes of setting up a payment plan, and for him to be able to have the surgical procedure. I am going to give him a bit of oxycodone, he will continue gabapentin and we will increase his Cymbalta. Certainly the chronic pain, and severe degenerative disc disease has worsened his depressive symptoms, I do think in the grand scheme of things he would be one of the rare candidates that I would support at least partial disability for.

## 2014-07-29 NOTE — Assessment & Plan Note (Signed)
Increasing Cymbalta to 120 mg. Certainly the chronic pain, and severe degenerative disc disease has worsened his depressive symptoms, I do think in the grand scheme of things he would be one of the rare candidates that I would support at least partial disability for.

## 2014-08-17 ENCOUNTER — Telehealth: Payer: Self-pay

## 2014-08-17 DIAGNOSIS — M545 Low back pain: Secondary | ICD-10-CM

## 2014-08-17 DIAGNOSIS — M5030 Other cervical disc degeneration, unspecified cervical region: Secondary | ICD-10-CM

## 2014-08-17 DIAGNOSIS — G8929 Other chronic pain: Secondary | ICD-10-CM

## 2014-08-17 NOTE — Telephone Encounter (Signed)
Patient request refill for Oxycodone. Last refill was 07/29/14 #40 was given. PLEASE ADVISE On last visit patient was advised to follow up around 08/26/14 and no follow up appt was made.Christopher Lloyd. Christopher Lloyd,CMA

## 2014-08-17 NOTE — Telephone Encounter (Signed)
Left message on patient mvm to call me back so I can give him the message below. Mitul Hallowell,CMA

## 2014-08-17 NOTE — Telephone Encounter (Signed)
Has he had a chance to get in with pain management? I can provide a single additional refill but he really needs to get into pain management for this. We will probably not do chronic narcotics.  Has he made any more progress with spine surgery elsewhere?

## 2014-08-18 MED ORDER — OXYCODONE-ACETAMINOPHEN 10-325 MG PO TABS: 1.0000 | ORAL_TABLET | Freq: Three times a day (TID) | ORAL | Status: DC | PRN

## 2014-08-18 NOTE — Telephone Encounter (Signed)
SPOKE WITH PATIENT HE STATED THAT HE HAS NOT TALKED TO ANYONE IN PAIN MANAGEMENT AND HE STATED THAT HE HAS A REFERRAL WITH SPINE SURGERY ON 09/02/2014. PLEASE ADVISE PATIENT ON WHAT PAIN MEDICATION WILL BE PRESCRIBED TO HIM. Rhonda Cunningham,CMA

## 2014-08-18 NOTE — Telephone Encounter (Signed)
Refilling Percocet, cutting back to #30 tablets, we will need to start a bit of a down taper, I have placed a referral for pain management as well.

## 2014-08-19 NOTE — Telephone Encounter (Signed)
Patient has been informed. Christopher Lloyd,CMA  

## 2014-08-22 ENCOUNTER — Other Ambulatory Visit: Payer: Self-pay | Admitting: Family Medicine

## 2014-09-15 ENCOUNTER — Ambulatory Visit (INDEPENDENT_AMBULATORY_CARE_PROVIDER_SITE_OTHER): Payer: BLUE CROSS/BLUE SHIELD | Admitting: Sports Medicine

## 2014-09-15 ENCOUNTER — Encounter: Payer: Self-pay | Admitting: Sports Medicine

## 2014-09-15 DIAGNOSIS — M5030 Other cervical disc degeneration, unspecified cervical region: Secondary | ICD-10-CM

## 2014-09-15 MED ORDER — HYDROMORPHONE HCL 4 MG PO TABS: ORAL_TABLET | ORAL | Status: AC

## 2014-09-15 NOTE — Progress Notes (Signed)
  Subjective:    CC:  Follow-up  HPI: Cervical degenerative disc disease: With adjacent level disease, unable to afford ACDF with Dr. Yevette Edwards, he is set up with [facility]. So far Percocet tens have not been effective.  No bowel or bladder dysfunction or saddle numbness, he is looking into social security disability.  Past medical history, Surgical history, Family history not pertinant except as noted below, Social history, Allergies, and medications have been entered into the medical record, reviewed, and no changes needed.   Review of Systems: No fevers, chills, night sweats, weight loss, chest pain, or shortness of breath.   Objective:    General: Well Developed, well nourished, and in no acute distress.  Neuro: Alert and oriented x3, extra-ocular muscles intact, sensation grossly intact.  HEENT: Normocephalic, atraumatic, pupils equal round reactive to light, neck supple, no masses, no lymphadenopathy, thyroid nonpalpable.  Skin: Warm and dry, no rashes. Cardiac: Regular rate and rhythm, no murmurs rubs or gallops, no lower extremity edema.  Respiratory: Clear to auscultation bilaterally. Not using accessory muscles, speaking in full sentences.  Impression and Recommendations:

## 2014-09-15 NOTE — Assessment & Plan Note (Signed)
History of a C5-C7 fusion that appears solid. Now has left-sided upper cervical radiculopathy and adjacent level disease at the C4-C5 level with some indentation of the cord with left-sided foraminal stenosis. Dr. Yevette Edwards did plan a C4-C5 ACDF however the upfront cost is too high, Christopher Lloyd does have an appointment with [facility] in the hopes of setting up a payment plan. His visit is on the 27th of this month. I'm going to give him some pain medicine in the meantime, we are going to increase to hydromorphone 4 mg 2-3 times per day. I'm happy to continue pain management until he sees the surgeon and has his procedure. In the grand scheme of things I do think he is one of the rare candidates that I would support long-term disability for.

## 2014-10-13 ENCOUNTER — Ambulatory Visit: Payer: BLUE CROSS/BLUE SHIELD | Admitting: Sports Medicine

## 2014-10-13 ENCOUNTER — Other Ambulatory Visit: Payer: Self-pay | Admitting: Family Medicine
